# Patient Record
Sex: Male | Born: 1935 | Race: White | Hispanic: No | Smoking: Former smoker
Health system: Southern US, Community
[De-identification: ages and names within clinical notes are randomized; demographics above are authoritative.]

## PROBLEM LIST (undated history)

## (undated) DIAGNOSIS — N400 Enlarged prostate without lower urinary tract symptoms: Secondary | ICD-10-CM

## (undated) DIAGNOSIS — M1990 Unspecified osteoarthritis, unspecified site: Secondary | ICD-10-CM

## (undated) DIAGNOSIS — F32A Depression, unspecified: Secondary | ICD-10-CM

## (undated) DIAGNOSIS — I10 Essential (primary) hypertension: Secondary | ICD-10-CM

## (undated) DIAGNOSIS — B019 Varicella without complication: Secondary | ICD-10-CM

## (undated) DIAGNOSIS — E119 Type 2 diabetes mellitus without complications: Secondary | ICD-10-CM

## (undated) DIAGNOSIS — Z860100 Personal history of colon polyps, unspecified: Secondary | ICD-10-CM

## (undated) DIAGNOSIS — R569 Unspecified convulsions: Secondary | ICD-10-CM

## (undated) DIAGNOSIS — M5137 Other intervertebral disc degeneration, lumbosacral region: Secondary | ICD-10-CM

## (undated) DIAGNOSIS — G4730 Sleep apnea, unspecified: Secondary | ICD-10-CM

## (undated) DIAGNOSIS — M51379 Other intervertebral disc degeneration, lumbosacral region without mention of lumbar back pain or lower extremity pain: Secondary | ICD-10-CM

## (undated) DIAGNOSIS — F329 Major depressive disorder, single episode, unspecified: Secondary | ICD-10-CM

## (undated) DIAGNOSIS — N200 Calculus of kidney: Secondary | ICD-10-CM

## (undated) HISTORY — PX: CATARACT EXTRACTION, BILATERAL: SHX1313

## (undated) HISTORY — DX: Type 2 diabetes mellitus without complications: E11.9

## (undated) HISTORY — PX: JOINT REPLACEMENT: SHX530

## (undated) HISTORY — DX: Personal history of colonic polyps: Z86.010

## (undated) HISTORY — DX: Other intervertebral disc degeneration, lumbosacral region: M51.37

## (undated) HISTORY — DX: Sleep apnea, unspecified: G47.30

## (undated) HISTORY — PX: TONSILLECTOMY: SUR1361

## (undated) HISTORY — PX: PROSTATE SURGERY: SHX751

## (undated) HISTORY — DX: Unspecified osteoarthritis, unspecified site: M19.90

## (undated) HISTORY — DX: Varicella without complication: B01.9

## (undated) HISTORY — DX: Other intervertebral disc degeneration, lumbosacral region without mention of lumbar back pain or lower extremity pain: M51.379

## (undated) HISTORY — DX: Personal history of colon polyps, unspecified: Z86.0100

## (undated) HISTORY — DX: Calculus of kidney: N20.0

---

## 2002-01-26 HISTORY — PX: COLON SURGERY: SHX602

## 2005-07-25 ENCOUNTER — Ambulatory Visit: Payer: Self-pay | Admitting: Gastroenterology

## 2006-05-10 ENCOUNTER — Ambulatory Visit: Payer: Self-pay | Admitting: Internal Medicine

## 2007-04-17 ENCOUNTER — Ambulatory Visit: Payer: Self-pay | Admitting: Internal Medicine

## 2008-02-03 ENCOUNTER — Emergency Department: Payer: Self-pay | Admitting: Emergency Medicine

## 2011-12-27 HISTORY — PX: ORIF HIP FRACTURE: SHX2125

## 2012-01-03 ENCOUNTER — Inpatient Hospital Stay: Payer: Self-pay | Admitting: Orthopedic Surgery

## 2012-01-03 LAB — COMPREHENSIVE METABOLIC PANEL
Albumin: 3.5 g/dL (ref 3.4–5.0)
Alkaline Phosphatase: 64 U/L (ref 50–136)
BUN: 24 mg/dL — ABNORMAL HIGH (ref 7–18)
Bilirubin,Total: 0.5 mg/dL (ref 0.2–1.0)
Calcium, Total: 8.9 mg/dL (ref 8.5–10.1)
Chloride: 108 mmol/L — ABNORMAL HIGH (ref 98–107)
EGFR (African American): >60
EGFR (Non-African Amer.): >60
Glucose: 159 mg/dL — ABNORMAL HIGH (ref 65–99)
Potassium: 3.9 mmol/L (ref 3.5–5.1)
SGOT(AST): 23 U/L (ref 15–37)
SGPT (ALT): 22 U/L
Sodium: 142 mmol/L (ref 136–145)

## 2012-01-03 LAB — APTT: Activated PTT: 31.4 secs (ref 23.6–35.9)

## 2012-01-03 LAB — CBC WITH DIFFERENTIAL/PLATELET
Basophil #: 0 10*3/uL (ref 0.0–0.1)
Basophil %: 0.3 %
Eosinophil #: 0.2 10*3/uL (ref 0.0–0.7)
HGB: 13.9 g/dL (ref 13.0–18.0)
Lymphocyte #: 0.7 10*3/uL — ABNORMAL LOW (ref 1.0–3.6)
Lymphocyte %: 13.6 %
MCH: 29.3 pg (ref 26.0–34.0)
MCHC: 33.3 g/dL (ref 32.0–36.0)
MCV: 88 fL (ref 80–100)
Neutrophil %: 75.8 %
Platelet: 144 10*3/uL — ABNORMAL LOW (ref 150–440)
RBC: 4.74 10*6/uL (ref 4.40–5.90)
WBC: 5.5 10*3/uL (ref 3.8–10.6)

## 2012-01-03 LAB — URINALYSIS, COMPLETE
Bacteria: NONE SEEN
Glucose,UR: NEGATIVE mg/dL (ref 0–75)
Ketone: NEGATIVE
Nitrite: NEGATIVE
Protein: NEGATIVE
Specific Gravity: 1.017 (ref 1.003–1.030)
WBC UR: 1 /HPF (ref 0–5)

## 2012-01-03 LAB — PROTIME-INR
INR: 0.9
Prothrombin Time: 12.9 secs (ref 11.5–14.7)

## 2012-01-04 LAB — BASIC METABOLIC PANEL
BUN: 19 mg/dL — ABNORMAL HIGH (ref 7–18)
Calcium, Total: 8.4 mg/dL — ABNORMAL LOW (ref 8.5–10.1)
Chloride: 104 mmol/L (ref 98–107)
Co2: 29 mmol/L (ref 21–32)
Creatinine: 0.88 mg/dL (ref 0.60–1.30)
EGFR (African American): >60
Potassium: 3.9 mmol/L (ref 3.5–5.1)
Sodium: 140 mmol/L (ref 136–145)

## 2012-01-04 LAB — CBC WITH DIFFERENTIAL/PLATELET
Basophil #: 0 10*3/uL (ref 0.0–0.1)
Basophil %: 0.2 %
Eosinophil #: 0.4 10*3/uL (ref 0.0–0.7)
HCT: 39.2 % — ABNORMAL LOW (ref 40.0–52.0)
HGB: 13.2 g/dL (ref 13.0–18.0)
Lymphocyte #: 1.2 10*3/uL (ref 1.0–3.6)
MCV: 88 fL (ref 80–100)
Monocyte %: 7.6 %
Neutrophil #: 5.4 10*3/uL (ref 1.4–6.5)
Neutrophil %: 71.2 %
RDW: 13.9 % (ref 11.5–14.5)

## 2012-01-04 LAB — HEMOGLOBIN A1C: Hemoglobin A1C: 6.5 % — ABNORMAL HIGH (ref 4.2–6.3)

## 2012-01-05 LAB — CBC WITH DIFFERENTIAL/PLATELET
Basophil %: 0.2 %
Eosinophil #: 0 10*3/uL (ref 0.0–0.7)
Eosinophil %: 0.4 %
HCT: 36.9 % — ABNORMAL LOW (ref 40.0–52.0)
HGB: 12.5 g/dL — ABNORMAL LOW (ref 13.0–18.0)
Lymphocyte %: 9.3 %
MCH: 29.7 pg (ref 26.0–34.0)
MCHC: 33.9 g/dL (ref 32.0–36.0)
MCV: 88 fL (ref 80–100)
Monocyte #: 1.1 x10 3/mm — ABNORMAL HIGH (ref 0.2–1.0)
Monocyte %: 10.3 %
Neutrophil %: 79.8 %
Platelet: 134 10*3/uL — ABNORMAL LOW (ref 150–440)
RDW: 13.6 % (ref 11.5–14.5)

## 2012-01-05 LAB — BASIC METABOLIC PANEL
Anion Gap: 8 (ref 7–16)
BUN: 19 mg/dL — ABNORMAL HIGH (ref 7–18)
Calcium, Total: 8.4 mg/dL — ABNORMAL LOW (ref 8.5–10.1)
Chloride: 102 mmol/L (ref 98–107)
Co2: 28 mmol/L (ref 21–32)
EGFR (African American): >60
Glucose: 152 mg/dL — ABNORMAL HIGH (ref 65–99)
Osmolality: 281 (ref 275–301)
Potassium: 3.8 mmol/L (ref 3.5–5.1)

## 2012-01-05 LAB — PATHOLOGY REPORT

## 2012-01-06 ENCOUNTER — Ambulatory Visit: Payer: Self-pay | Admitting: Urology

## 2012-01-07 LAB — BASIC METABOLIC PANEL
Anion Gap: 7 (ref 7–16)
BUN: 27 mg/dL — ABNORMAL HIGH (ref 7–18)
Co2: 29 mmol/L (ref 21–32)
EGFR (Non-African Amer.): >60
Glucose: 143 mg/dL — ABNORMAL HIGH (ref 65–99)
Osmolality: 283 (ref 275–301)
Sodium: 138 mmol/L (ref 136–145)

## 2012-01-07 LAB — HEMOGLOBIN: HGB: 10.3 g/dL — ABNORMAL LOW (ref 13.0–18.0)

## 2012-01-09 LAB — BASIC METABOLIC PANEL
Anion Gap: 9 (ref 7–16)
BUN: 29 mg/dL — ABNORMAL HIGH (ref 7–18)
Calcium, Total: 8.5 mg/dL (ref 8.5–10.1)
Chloride: 100 mmol/L (ref 98–107)
Creatinine: 1.38 mg/dL — ABNORMAL HIGH (ref 0.60–1.30)
EGFR (African American): 53 — ABNORMAL LOW
EGFR (Non-African Amer.): 46 — ABNORMAL LOW
Glucose: 175 mg/dL — ABNORMAL HIGH (ref 65–99)
Potassium: 3.5 mmol/L (ref 3.5–5.1)
Sodium: 139 mmol/L (ref 136–145)

## 2012-01-09 LAB — CBC WITH DIFFERENTIAL/PLATELET
Eosinophil #: 0.2 10*3/uL (ref 0.0–0.7)
Lymphocyte #: 0.8 10*3/uL — ABNORMAL LOW (ref 1.0–3.6)
MCH: 29.3 pg (ref 26.0–34.0)
Monocyte #: 0.8 x10 3/mm (ref 0.2–1.0)
Neutrophil #: 6.9 10*3/uL — ABNORMAL HIGH (ref 1.4–6.5)
Neutrophil %: 78.5 %
Platelet: 214 10*3/uL (ref 150–440)
RBC: 3.63 10*6/uL — ABNORMAL LOW (ref 4.40–5.90)
RDW: 14.4 % (ref 11.5–14.5)

## 2012-01-10 ENCOUNTER — Encounter: Payer: Self-pay | Admitting: Internal Medicine

## 2012-01-27 ENCOUNTER — Encounter: Payer: Self-pay | Admitting: Internal Medicine

## 2012-04-22 ENCOUNTER — Ambulatory Visit: Payer: Self-pay | Admitting: Neurology

## 2012-05-01 ENCOUNTER — Ambulatory Visit: Payer: Self-pay | Admitting: Neurology

## 2012-09-20 ENCOUNTER — Ambulatory Visit: Payer: Self-pay | Admitting: Neurology

## 2013-07-25 ENCOUNTER — Inpatient Hospital Stay: Payer: Self-pay | Admitting: Internal Medicine

## 2013-07-25 LAB — CBC WITH DIFFERENTIAL/PLATELET
Basophil #: 0 10*3/uL (ref 0.0–0.1)
HCT: 46.7 % (ref 40.0–52.0)
Lymphocyte #: 0.2 10*3/uL — ABNORMAL LOW (ref 1.0–3.6)
MCHC: 33 g/dL (ref 32.0–36.0)
MCV: 89 fL (ref 80–100)
Monocyte #: 0.4 x10 3/mm (ref 0.2–1.0)
Monocyte %: 4.3 %
Neutrophil #: 9.1 10*3/uL — ABNORMAL HIGH (ref 1.4–6.5)
Neutrophil %: 92.9 %
Platelet: 184 10*3/uL (ref 150–440)
RDW: 13.6 % (ref 11.5–14.5)
WBC: 9.8 10*3/uL (ref 3.8–10.6)

## 2013-07-25 LAB — BASIC METABOLIC PANEL
Calcium, Total: 9 mg/dL (ref 8.5–10.1)
Creatinine: 1.46 mg/dL — ABNORMAL HIGH (ref 0.60–1.30)
EGFR (African American): 49 — ABNORMAL LOW
EGFR (Non-African Amer.): 42 — ABNORMAL LOW
Osmolality: 292 (ref 275–301)
Potassium: 4 mmol/L (ref 3.5–5.1)
Sodium: 140 mmol/L (ref 136–145)

## 2013-07-25 LAB — URINALYSIS, COMPLETE
Hyaline Cast: 3
Ketone: NEGATIVE
Ph: 5 (ref 4.5–8.0)
Protein: 100
Specific Gravity: 1.025 (ref 1.003–1.030)
WBC UR: 6 /HPF (ref 0–5)

## 2013-07-25 LAB — OCCULT BLOOD X 1 CARD TO LAB, STOOL: Occult Blood, Feces: NEGATIVE

## 2013-07-25 LAB — TROPONIN I: Troponin-I: 0.02 ng/mL

## 2013-07-27 LAB — CBC WITH DIFFERENTIAL/PLATELET
Eosinophil #: 0.4 10*3/uL (ref 0.0–0.7)
Eosinophil %: 6.9 %
HCT: 38.6 % — ABNORMAL LOW (ref 40.0–52.0)
Lymphocyte #: 1.3 10*3/uL (ref 1.0–3.6)
MCHC: 34.1 g/dL (ref 32.0–36.0)
MCV: 87 fL (ref 80–100)
Monocyte %: 12.4 %
Platelet: 147 10*3/uL — ABNORMAL LOW (ref 150–440)
RBC: 4.42 10*6/uL (ref 4.40–5.90)
WBC: 6.4 10*3/uL (ref 3.8–10.6)

## 2013-07-27 LAB — COMPREHENSIVE METABOLIC PANEL
Alkaline Phosphatase: 67 U/L
BUN: 21 mg/dL — ABNORMAL HIGH (ref 7–18)
Bilirubin,Total: 0.5 mg/dL (ref 0.2–1.0)
Calcium, Total: 8.8 mg/dL (ref 8.5–10.1)

## 2013-07-29 LAB — URINE CULTURE

## 2013-07-30 LAB — STOOL CULTURE

## 2014-01-09 ENCOUNTER — Ambulatory Visit: Payer: Self-pay | Admitting: Internal Medicine

## 2014-07-16 LAB — COMPREHENSIVE METABOLIC PANEL
ALT: 51 U/L
Albumin: 3.2 g/dL — ABNORMAL LOW (ref 3.4–5.0)
Alkaline Phosphatase: 91 U/L
Anion Gap: 6 — ABNORMAL LOW (ref 7–16)
BILIRUBIN TOTAL: 0.5 mg/dL (ref 0.2–1.0)
BUN: 18 mg/dL (ref 7–18)
CALCIUM: 8.5 mg/dL (ref 8.5–10.1)
CHLORIDE: 107 mmol/L (ref 98–107)
CO2: 28 mmol/L (ref 21–32)
Creatinine: 1.08 mg/dL (ref 0.60–1.30)
EGFR (Non-African Amer.): >60
Glucose: 170 mg/dL — ABNORMAL HIGH (ref 65–99)
Osmolality: 287 (ref 275–301)
POTASSIUM: 3.9 mmol/L (ref 3.5–5.1)
SGOT(AST): 47 U/L — ABNORMAL HIGH (ref 15–37)
Sodium: 141 mmol/L (ref 136–145)
TOTAL PROTEIN: 6.3 g/dL — AB (ref 6.4–8.2)

## 2014-07-16 LAB — CBC
HCT: 42.2 % (ref 40.0–52.0)
HGB: 13.9 g/dL (ref 13.0–18.0)
MCH: 30.2 pg (ref 26.0–34.0)
MCHC: 33 g/dL (ref 32.0–36.0)
MCV: 91 fL (ref 80–100)
Platelet: 138 10*3/uL — ABNORMAL LOW (ref 150–440)
RBC: 4.62 10*6/uL (ref 4.40–5.90)
RDW: 13.6 % (ref 11.5–14.5)
WBC: 6.2 10*3/uL (ref 3.8–10.6)

## 2014-07-16 LAB — TROPONIN I: TROPONIN-I: 0.03 ng/mL

## 2014-07-17 ENCOUNTER — Observation Stay: Payer: Self-pay | Admitting: Internal Medicine

## 2014-07-17 LAB — LIPID PANEL
Cholesterol: 117 mg/dL (ref 0–200)
HDL Cholesterol: 32 mg/dL — ABNORMAL LOW (ref 40–60)
Ldl Cholesterol, Calc: 65 mg/dL (ref 0–100)
Triglycerides: 99 mg/dL (ref 0–200)
VLDL Cholesterol, Calc: 20 mg/dL (ref 5–40)

## 2014-07-17 LAB — TSH: Thyroid Stimulating Horm: 3.92 u[IU]/mL

## 2014-07-17 LAB — HEMOGLOBIN A1C: Hemoglobin A1C: 7.5 % — ABNORMAL HIGH (ref 4.2–6.3)

## 2014-12-18 NOTE — H&P (Signed)
PATIENT NAME:  Patrick Chan, Patrick Chan MR#:  [id number] DATE OF BIRTH:  Jan 25, 1922  DATE OF ADMISSION:  07/25/2013  PRIMARY CARE PHYSICIAN: Dr. Aletha Halim; previously the patient used to go to Dr. Alonna Buckler.  CHIEF COMPLAINT:  Diarrhea.   HISTORY OF PRESENT ILLNESS: The patient is a 81 year old Caucasian male with a past medical history of hypertension, benign prostatic hypertrophy, obstructive sleep apnea and depression, is presenting to the ER with a chief complaint of persistent diarrhea for the past two days. The patient is reporting that after he had Thanksgiving dinner last night, he started at having diarrhea. He is having watery diarrhea with no blood. So far he had  20 BMs. He denies any nausea, vomiting or abdominal pain. No fever. Denies any dizziness, but complaining of weakness. He also reported no other friends or family members developed this problem following the dinner. In the ER, stool was ordered for C. Diff toxin, occult blood and culture. As the patient is feeling weak, CAT scan of the head is ordered, which is negative. Chest x-ray was also negative. The patient was given IV fluids and the hospitalist team is called to admit the patient. No other complaints.   PAST MEDICAL HISTORY: Hypertension, benign prostatic hypertrophy, obstructive sleep apnea, colon polyps, depression. In the previous records, it was mentioned that patient was diabetic. The patient is reporting that for the past six months he was under observation and diabetes is ruled out. Apparently, the patient is not on any medications and he is reporting that he is free from diabetes.   PAST SURGICAL HISTORY: Tonsil surgery, prostate resection secondary to benign prostatic hypertrophy in 1993, colon polyp removal, bilateral cataract surgery in 2010.  ALLERGIES: No known drug allergies.   PSYCHOSOCIAL HISTORY: Lives in the [facility] independent living facility. Denies any history of smoking, alcohol or illicit  drug usage.   FAMILY HISTORY: Both parents are healthy.   HOME MEDICATIONS: Tylenol 500 mg 0.5 mg once a day, tramadol 50 mg 1 tablet 2 times a day, multivitamin once daily, metoprolol tartrate 50 mg 1/2 tablet once daily, Keppra 750 mg 2 times a day, finasteride 5 mg once daily, citalopram 20 mg once daily, Celebrex 200 mg 1 capsule once a day, aspirin 81 mg once daily.   REVIEW OF SYSTEMS: CONSTITUTIONAL: Denies any fever, complaining of fatigue and weakness.  EYES: Denies blurry vision, glaucoma.  ENT: Denies epistaxis, discharge.  RESPIRATION:  Denies cough, COPD.   CARDIOVASCULAR: No chest pain or palpitations.  GASTROINTESTINAL: Denies nausea or vomiting. Complaining of diarrhea. Denies abdominal pain, hematemesis, melena,  GENITOURINARY: No dysuria or hematuria. Denies any hernias.  ENDOCRINE: Denies polyuria, nocturia. Denies any diabetes, thyroid problems. LYMPHATIC  No anemia, easy bruising, bleeding.  INTEGUMENTARY: No acne, rash, lesions.  MUSCULOSKELETAL: No joint pain in the neck and back. Denies gout.  NEUROLOGIC:  No vertigo or ataxia.  PSYCHIATRIC: No ADD, OCD.   PHYSICAL EXAMINATION: VITAL SIGNS: Temperature 98.8, pulse 74, respirations 20, blood pressure 145/75, pulse oximetry 96%.  GENERAL APPEARANCE: Not in acute distress. Moderately built and nourished.  HEENT: Normocephalic, atraumatic. Pupils are equally reactive to light and accommodation. Extraocular movements are intact. No scleral icterus. No conjunctival injection. No sinus tenderness, nasal patent, no discharge. Dry mucous membranes. Oral cavity is intact.  NECK: Patrick Chan. No JVD. No thyromegaly. Range of motion is intact.  LUNGS: Clear to auscultation bilaterally. No accessory muscle use and no anterior chest wall tenderness on palpation.  CARDIAC: S1, S2  normal. Regular rate and rhythm. No murmurs.  GASTROINTESTINAL: Soft. Bowel sounds are positive in all four quadrants. Nontender, nondistended. No  hepatosplenomegaly. No masses felt.  NEUROLOGIC: Awake, alert, oriented x 3. Cranial nerves II through XII are grossly intact. Motor and sensory intact. Reflexes are 2+.  EXTREMITIES: No edema. No cyanosis. No clubbing. Peripheral pulses, dorsalis pedis and posterior tibialis are intact.   SKIN: Warm to touch. Dry in nature. No turgor, no rashes, no lesions.  PSYCHIATRIC: Normal mood and affect.  LABS AND IMAGING STUDIES: CAT scan of the head without contrast has revealed no acute intracranial abnormalities, atrophic and small vessel ischemic changes. Chest x-ray, portable no acute changes. Glucose at 210, BUN 31, creatinine 1.46, sodium 146, potassium 4.0, chloride 111, CO2 21 and anion gap is 8, GFR 42. Serum osmolality 292, calcium 9. CBC normal. Troponin less than 0.02. Stool for C. difficile toxin is negative. Urinalysis yellow in color, nitrite and leukocyte esterase are negative. Stool for occult blood is negative.   ASSESSMENT AND PLAN: An 81 year old Caucasian male presenting to the ER with a chief complaint of a two day history of diarrhea with no abdominal pain, will be admitted with following assessment and plan.  1.  Acute gastroenteritis, probably viral. We will admit him to the hospital. We will provide him IV fluids. We will check his stool. Stool cultures are pending. We will provide him IV hydration.  2.  Dehydration with acute kidney injury. Avoid nephrotoxins. Probably, this is prerenal in nature from dehydration. Will provide IV fluids and monitor renal function closely.  3.  The patient will be on enteric precautions until stool tests results are obtained.   4.  Hypertension. Resume his home medication. Continue metoprolol.  5.  Benign prostatic hypertrophy. Resume Proscar.  6.  The patient is reporting that he is not diabetic. Will check his hemoglobin A1c.  7.  We will provide him gastrointestinal and deep vein thrombosis prophylaxis.  8.  He is full code. Son is medical  power of attorney. Diagnosis and plan of care was discussed in detail with the patient and his son at bedside. They both verbalized understanding of the plan.  The patient will be transferred to Dr. Aletha HalimJeff Sparks in a.m. The patient used to see Dr. Alonna BucklerAndrew Lamb in the past.   Total time spent on admission: 45 minutes.    ____________________________ Ramonita LabAruna Lailany Enoch, MD ag:NTS D: 07/26/2013 00:36:25 ET T: 07/26/2013 01:19:38 ET JOB#: [id number]  cc: Ramonita LabAruna Shawnte Demarest, MD, <Dictator> Ramonita LabARUNA Avnoor Koury MD ELECTRONICALLY SIGNED 07/30/2013 7:14

## 2014-12-18 NOTE — Discharge Summary (Signed)
PATIENT NAME:  Patrick Chan, Patrick Chan MR#:  [id number] DATE OF BIRTH:  1922/06/12  DATE OF ADMISSION:  07/25/2013 DATE OF DISCHARGE:  07/27/2013  DISCHARGE DIAGNOSES: 1.  Acute renal failure secondary to prerenal status/dehydration.  2.  Diarrhea secondary to Campylobacter.  3.  Weakness and confusion secondary to the above.   HOSPITAL COURSE:  Negative head CT, negative chest x-ray. Stool negative for Clostridium difficile, positive for Campylobacter jejuni. Creatinine decreased to  1.08 by today's date, 1.46 on admission with estimated GFR greater than 60. He ambulated well with PT greater than 250 feet. Glucose down to 114 this morning. Hemoglobin A1c was done at 7.2, will just give him diet-controlled diabetes which should be followed up going forward.   DISPOSITION:  [facility]. Needs to see Dr. Judithann SheenSparks next week and check in with Jacki ConesLaurie at [facility] as well.   TIME SPENT: Please note, it took approximately 40 minutes to do all discharge tasks.     ____________________________ Marya AmslerMarshall W. Dareen PianoAnderson, MD mwa:cs D: 07/27/2013 11:10:33 ET Chan: 07/27/2013 19:40:39 ET JOB#: [id number]  cc: Marya AmslerMarshall W. Dareen PianoAnderson, MD, <Dictator> Lauro RegulusMARSHALL W Shakiah Wester MD ELECTRONICALLY SIGNED 07/28/2013 8:02

## 2014-12-19 NOTE — H&P (Signed)
PATIENT NAME:  Patrick Chan, WESTMAN MR#:  [id number] DATE OF BIRTH:  [birth year]  DATE OF ADMISSION:  07/17/2014  ADMISSION DIAGNOSIS:  Transient ischemic attack.   REFERRING PHYSICIAN:  Loraine Leriche R. Fanny Bien, MD   PRIMARY CARE PHYSICIAN:  Duane Lope. Judithann Sheen, MD    HISTORY OF PRESENT ILLNESS:  This is a 81 year old Caucasian male who presents to the Emergency Department with diplopia. The patient had called his primary care doctor earlier in the afternoon after experiencing 2 to 3 hours of double vision associated with some nausea. His primary care doctor recommended the patient come to the Emergency Department for an MRI. In the Emergency Department, the patient states that his double vision and nausea have subsided. He denies any chest pain, shortness of breath, nausea, vomiting, or diaphoresis, but he admits to some dizziness when he was seeing double. Due to his new-onset symptoms of diplopia and the recommendation of his primary care doctor, the patient was admitted to the observation service for further workup of potential transient ischemic attack.   REVIEW OF SYSTEMS: CONSTITUTIONAL:  The patient denies fever or weakness.  EYES:  Denies inflammation or blurred vision.  EARS, NOSE, AND THROAT:  Denies tinnitus or difficulty swallowing.  RESPIRATORY:  Denies cough or shortness of breath.  CARDIOVASCULAR:  Denies chest pain, palpitations, or dyspnea on exertion.  GASTROINTESTINAL:  Admits to some nausea but denies vomiting, diarrhea, or abdominal pain.  GENITOURINARY:  Denies dysuria, increased frequency, or hesitancy of urination.  ENDOCRINE:  Denies polyuria or polydipsia.  HEMATOLOGIC AND LYMPHATIC:  Denies easy bleeding or bruising.  INTEGUMENT:  Denies rashes or lesions.  MUSCULOSKELETAL:  Admits to joint pain from arthritis, but denies myalgias. NEUROLOGIC:  Denies numbness in his extremities or dysarthria.  PSYCHIATRIC:  Denies depression or suicidal ideation.   PAST MEDICAL HISTORY:   Hypertension, obstructive sleep apnea, glaucoma, benign prostatic hypertrophy, osteoarthritis, and seizure disorder.   PAST SURGICAL HISTORY:  Right hip repair, bilateral cataract removal.   SOCIAL HISTORY:  The patient denies smoking, drinking, or doing any drugs. He is a widower and lives in an independent living facility or apartment.   FAMILY HISTORY:  None.   MEDICATIONS: 1.  Aspirin 81 mg delayed-release enteric-coated capsule 1 tab p.o. daily.  2.  Citalopram 20 mg 1 tab p.o. daily.  3.  Finasteride 5 mg 1 tab p.o. daily.  4.  Keppra 750 mg 1 tab p.o. b.i.d.  5.  Metoprolol tartrate 50 mg 1/2 tablet p.o. daily.  6.  Multivitamin tablet 1 tab p.o. daily.  7.  Tramadol 50 mg 1 tablet p.o. b.i.d. as needed for pain.  8.  Trazodone 50 mg 1/2 tablet to 1 tablet p.o. at bedtime.   ALLERGIES:  No known drug allergies.   PERTINENT LABORATORY RESULTS AND RADIOGRAPHIC FINDINGS:  Serum glucose is 170, BUN 18, creatinine 1.08, serum sodium 141, potassium 2.9, chloride 107, bicarbonate 28, calcium 8.5, serum albumin 3.2, alkaline phosphatase 91, AST 47, and ALT is 51. Troponin is 0.03. Thyroid stimulating hormone is 3.92. White blood cell count is 6.2, hemoglobin 13.9, hematocrit 42.2, and platelet count is 138.   CT of the head without contrast shows no acute intracranial pathology, but there is some chronic microvascular disease and cerebral atrophy.   PHYSICAL EXAMINATION: VITAL SIGNS:  Temperature is 97.9, pulse 60, respirations 20, blood pressure 190/81, pulse oximetry is 99% on room air.  GENERAL:  The patient is alert and oriented x 3 and in no apparent distress.  HEENT:  Normocephalic, atraumatic. Pupils are equal, round, and reactive to light and accommodation. Extraocular movements are intact. Mucous membranes are moist.  NECK:  Trachea is midline. No adenopathy.  CHEST:  Symmetric and atraumatic.  CARDIOVASCULAR:  Regular rate and rhythm. Normal S1, S2. No rubs, clicks, or murmurs  appreciated.  LUNGS:  Clear to auscultation bilaterally. Normal effort and excursion.  ABDOMEN:  Positive bowel sounds. Soft, nontender, nondistended. No hepatosplenomegaly.  GENITOURINARY:  Deferred.  MUSCULOSKELETAL:  The patient moves all 4 extremities equally. There is 5/5 strength in upper and lower extremities bilaterally.  SKIN:  No rashes or lesions.  EXTREMITIES:  No clubbing, cyanosis, or edema.   NEUROLOGIC:  Cranial nerves II through XII are grossly intact. The patient has no dysdiadochokinesis or past pointing. Visual fields are intact as well.  PSYCHIATRIC:  Mood is normal. Affect is congruent.   ASSESSMENT AND PLAN:  This is a 81 year old male with a presumed transient ischemic attack, who presents with diplopia.   1.  Transient ischemic attack. The patient admits to 2 to 3 hours of symptoms that have resolved. The etiology of diplopia is large a differential as even the complaint itself has room for misinterpretation. Due to his primary care doctor's concern for transient ischemic attack and his request for MRI, the patient has been admitted for observation. We will continue to monitor him. EKG shows no acute changes, but there is a first-degree block as well as a right bundle branch. I have ordered the MRI for the morning, and his primary care doctor may order a neurology consult if he sees fit.  2.  Hypertension. We will continue the patient's home regimen of metoprolol.  3.  Glaucoma. Continue Lumigan drops if needed.  4.  Benign prostatic hypertrophy. Continue finasteride.  5.  Seizure disorder. Continue Keppra.  6.  Obstructive sleep apnea. The patient usually wears CPAP at night. He does not have it here with him and he likely will not spend a full night in the hospital. If his stay is longer, we will restart the patient's positive pressure ventilation on his home settings.  7.  Osteoarthritis. Tramadol as needed for pain.  8.  Deep vein thrombosis prophylaxis with heparin.   9.  Gastrointestinal prophylaxis is unnecessary, as the patient is not critically ill.   TIME SPENT ON ADMISSION ORDERS AND PATIENT CARE:  Approximately 35 minutes.    ____________________________ Kelton PillarMichael S. Sheryle Hailiamond, MD msd:nb D: 07/17/2014 05:53:54 ET T: 07/17/2014 06:46:45 ET JOB#: [id number]  cc: Kelton PillarMichael S. Sheryle Hailiamond, MD, <Dictator> Kelton PillarMICHAEL S Osias Resnick MD ELECTRONICALLY SIGNED 07/18/2014 0:55

## 2014-12-20 NOTE — Consult Note (Signed)
PATIENT NAME:  Patrick Chan, [name] MR#:  [id number] DATE OF BIRTH:  Feb 13, 1922  DATE OF CONSULTATION:  01/03/2012  REFERRING PHYSICIAN:  Kathreen DevoidKevin L. Krasinski, MD   CONSULTING PHYSICIAN:  Marcina MillardAlexander Jann Ra, MD  REASON FOR CONSULTATION: Preoperative cardiovascular evaluation and abnormal EKG.   HISTORY OF PRESENT ILLNESS: The patient is an 81 year old gentleman referred for preoperative cardiovascular evaluation. The patient has a history of obstructive sleep apnea. He has a right hip fracture awaiting hip replacement surgery. The patient currently denies chest pain, shortness of breath, orthopnea, paroxysmal nocturnal dyspnea, pedal edema, presyncope, or syncope. The patient denies a history of hypertension, diabetes, chronic kidney disease, prior myocardial infarction, stroke or congestive heart failure. EKG reveals sinus rhythm with first degree AV block and right bundle branch block.   PAST MEDICAL HISTORY:  1. Hypertension.  2. Obstructive sleep apnea.  3. Benign prostatic hypertrophy.  4. Nephrolithiasis.  5. Diabetes.   MEDICATIONS:  1. Proscar 5 mg daily.  2. Lopressor 25 mg daily.  3. Escitalopram 20 mg daily.  4. Diazepam 5 mg every 6 hours p.r.n.  5. Multivitamin 1 daily.  6. Aspirin 81 mg daily.  7. Glucosamine/chondroitin 1 tab b.i.d.   SOCIAL HISTORY: [facility] [place]. He denies tobacco abuse.    FAMILY HISTORY: No immediate family history for coronary artery disease or myocardial infarction.    REVIEW OF SYSTEMS: CONSTITUTIONAL: No fever or chills. EYES: No blurry vision. EARS: No hearing loss. RESPIRATORY: No shortness of breath. CARDIOVASCULAR: No chest pain. GASTROINTESTINAL: No nausea, vomiting, or diarrhea. GU: No dysuria or hematuria. ENDOCRINE: No polyuria or polydipsia. MUSCULOSKELETAL: No arthralgias or myalgias. NEUROLOGICAL: No focal muscle weakness or numbness. PSYCHOLOGICAL: No depression or anxiety.   PHYSICAL  EXAMINATION:  VITAL SIGNS: Blood pressure 151/78, pulse 57, respirations 18, temperature 97.4, pulse oximetry 88%.   HEENT: Pupils are equal and reactive to light and accommodation.   NECK: Supple without thyromegaly.   LUNGS: Clear.   CARDIOVASCULAR: Normal jugular venous pressure. Normal point of maximal impulse. Regular rate and rhythm. Normal S1, S2. No appreciable gallop, murmur, or rub.   ABDOMEN: Soft and nontender. Pulses were intact bilaterally.   MUSCULOSKELETAL: Normal muscle tone.   NEUROLOGICAL: The patient is alert and oriented x3. Motor and sensory are both grossly intact.   IMPRESSION: An 81 year old gentleman awaiting right hip replacement surgery without high-risk features for cardiovascular complication during surgery. The patient has no prior history of myocardial infarction, stroke, congestive heart failure, chronic kidney disease, and currently is not taking any therapy for diabetes. Hip surgery would be considered as intermediate risk type of surgery. The patient would be a low risk for serious cardiovascular complication.   RECOMMENDATIONS:  1. Proceed with hip replacement surgery as planned.  2. Continue metoprolol pre-, peri and postoperatively.  3. No further cardiac noninvasive evaluation prior to surgery at this time.   ____________________________ Marcina MillardAlexander Travante Knee, MD ap:cbb D: 01/03/2012 16:36:44 ET T: 01/03/2012 17:47:47 ET JOB#: [id number]  cc: Marcina MillardAlexander Marquita Lias, MD, <Dictator> Marcina MillardALEXANDER Mattis Featherly MD ELECTRONICALLY SIGNED 01/04/2012 10:59

## 2014-12-20 NOTE — Op Note (Signed)
PATIENT NAME:  Patrick Chan, Patrick Chan MR#:  [id number] DATE OF BIRTH:  05/17/1922  DATE OF PROCEDURE:  01/04/2012  PREOPERATIVE DIAGNOSIS: Right femoral neck hip fracture.   POSTOPERATIVE DIAGNOSIS: Right femoral neck hip fracture.   PROCEDURE: Right hip hemiarthroplasty.   SURGEON: Patrick Chan, M.D.   ANESTHESIA: Spinal.   ESTIMATED BLOOD LOSS: 300 mL.   COMPLICATIONS: None.   INDICATIONS FOR PROCEDURE: [name] is an 81 year old active male. He sustained a mechanical fall when he tripped off a curb on his way to work. The patient fell onto his right side and sustained a right femoral neck hip fracture. He was unable to stand or bear weight on the right lower extremity following this injury. He was diagnosed with a displaced right femoral neck hip fracture by x-ray in the [facility]. Given his high level of functioning and the nature of this fracture, I recommended a right hip hemiarthroplasty. I reviewed the risks and benefits of surgery with the patient and his family. The patient has agreed to undergo surgery.   PROCEDURE NOTE: The patient was marked with the word yes over the right hip according the hospital's right site protocol. He was then brought to the operating room where he underwent placement of a spinal anesthetic by the anesthesia service. He was then placed in a left lateral  recumbent position. He was held into position with a pegboard. All of pegs were adequately padded to avoid undue pressure on the patient's skin. He was prepped and draped in sterile fashion. An axillary role was  placed under his left side and care was taken to pad his well leg and to avoid injury to the common peroneal nerve. The patient was then prepped and draped in a sterile fashion.   A time-out was performed to verify the patient's name, date of birth, medical record number, correct site of surgery, and correct procedure to be performed. It was also used to verify the  patient had received antibiotics and that all appropriate instruments, implants, and radiographic studies were available in the room. Once all in attendance were in agreement, the case began. He received 2 grams of Kefzol prior to the incision being made.   Anatomic landmarks were drawn out with a surgical marker as was the proposed incision. A #10 blade was used to make a curved incision based over the greater trochanter of the right hip. The subcutaneous tissues were carefully dissected using electrocautery until the fascia lata was identified. This was then cleared of overlying adipose tissue and a deep #10 blade was used to make the incision through the fascia lata. The gluteus maximus muscle was identified and then split in line with its fibers, revealing the underlying hip bursa. The bursa was resected and the external rotators were removed from their posterior attachment to the greater trochanter. They were tagged with a #2 Vicryl for later repair and reflected posteriorly to protect the sciatic nerve. The hip capsule was then identified. A Chan-shaped capsulotomy was performed and both leaflets of the hip capsule were tagged again with a #2 Ti-Cron for later repair. The fracture was then easily visualized. The femoral head was removed using a corkscrew device.   Attention was then turned initially to the femur. A femoral osteotomy template was used to mark the appropriate angle and level of the osteotomy. The osteotomy was performed approximately 1 cm above the lesser trochanter. After the femoral osteotomy was successfully completed, Cobra retractors were placed around the acetabulum. The  femoral head that was resected was measured to be 56-mm in diameter. The 56-mm trial was then placed in the acetabulum and found to have excellent suction fit.   Attention was then turned back to femoral canal preparation. A box osteotome created the initial entry into the proximal femur. A hand reamer was then advanced  into the femoral canal and a canal finding device was used to confirm the path of the femoral canal.   Sequential broaches were then used starting with a size zero and going up in half sizes until a size five trial broach was inserted. This was found to have the best medial and lateral fit and had no rotational instability. The trial neck and a 56-mm outer diameter head was then placed onto the femoral trial prosthesis and reduced. The hip was taken through a full range of motion and deemed to be stable. All trial instruments were then removed. The femoral canal and acetabulum were then copiously irrigated.   The actual size five Stryker Accolade HFX stem was then inserted into the femoral canal. The patient then was trialed with a neutral neck and a -4 neck trial prosthesis. The patient was found to have adequate stability, but better leg length with a -4. Therefore, a 56-mm head with a -4 neck sleeve was inserted onto the actual size five femoral component and reduced into the acetabulum. The hip was again taken through a full range of motion and found to have excellent motion and stability. The hip joint was then copiously irrigated.   The Chan-shaped capsulotomy was closed using #2 Ti-Cron.  A posterior repair with both the capsule and external rotators was then performed. This was a soft tissue repair. Again, the wound was copiously irrigated. The fascia lata was closed with interrupted 0 Vicryl sutures. The subcutaneous tissue was closed in two layers with 2-0 Vicryl and the skin approximated with skin staples. A dry sterile dressing was applied. The patient was then turned onto his back and his leg lengths were found to be equivalent. A hip abduction pillow was placed between his knees and he was transferred to a hospital bed and brought to the PAC-U in stable condition. I was scrubbed and present for the entire case and all sharp and instrument counts were correct at the conclusion of the case. I spoke  with the patient's family in the postoperative consultation room to let them know the case had gone without complication and that Mr. Letarte was stable in the recovery room.     ____________________________ Kathreen Devoid, MD klk:bjt D: 01/05/2012 12:29:37 ET Chan: 01/05/2012 12:47:33 ET JOB#: [id number]  cc: Kathreen Devoid, MD, <Dictator> Kathreen Devoid MD ELECTRONICALLY SIGNED 01/10/2012 9:32

## 2014-12-20 NOTE — H&P (Signed)
Subjective/Chief Complaint Right hip pain    History of Present Illness 81 year old male fell off a curb and landed on his right hip.  He was unable to stand after the injury.  He denies LOC or other injuries.  He complains only of right hip pain.  He was diagnosed with a right femoral neck hip fracture upon presentation to the [facility] ER by x-ray.   Past Med/Surgical Hx:  HTN:   glaucoma:   bordeline diabetic:   ALLERGIES:  No Known Allergies:   HOME MEDICATIONS: Medication Instructions Status  Proscar 5 mg oral tablet 1 tab(s) orally once a day Active  metoprolol tartrate 50 mg oral tablet 0.5 tab(s) orally once a day Active  citalopram 20 mg oral tablet 1 tab(s) orally once a day Active  diazepam 5 mg oral tablet 1 tab(s) orally every 6 hours, As Needed Active  multivitamin 1 tab(s) orally once a day Active  chondroitin-glucosamine 1 tab(s) orally 2 times a day (750-666m) Active  aspirin 81 mg oral delayed release tablet 1 tab(s) orally once a day Active  Celebrex 200 mg oral capsule 1 cap(s) orally once a day, As Needed- for Pain (may take twice a day if pain is worse, just occasionally) Active   Family and Social History:   Family History Non-Contributory    Place of Living Home   Review of Systems:   Subjective/Chief Complaint Right hip pain   Physical Exam:   GEN no acute distress    HEENT PERRL, hearing intact to voice, dry oral mucosa, Oropharynx clear, good dentition    NECK supple  No masses  trachea midline    RESP normal resp effort  clear BS  no use of accessory muscles    CARD regular rate  no murmur  No LE edema  no JVD    ABD denies tenderness  soft  normal BS  no Adominal Mass    GU foley catheter in place  mild hematuria likely from insertion in setting of BPH    LYMPH negative neck    EXTR negative cyanosis/clubbing, Right leg in Buck's traction.  Skin intact.  No erythema or eccyhymosis    SKIN normal to palpation    NEURO motor/sensory  function intact    PSYCH alert   Routine Coag:  08-May-13 08:52    Prothrombin 12.9   Activated PTT (APTT) 31.4  Routine Hem:  08-May-13 08:52    WBC (CBC) 5.5   RBC (CBC) 4.74   Hemoglobin (CBC) 13.9   Hematocrit (CBC) 41.6   Platelet Count (CBC) 144   MCV 88   MCH 29.3   MCHC 33.3   RDW 13.9   Neutrophil % 75.8   Lymphocyte % 13.6   Monocyte % 7.5   Eosinophil % 2.8   Basophil % 0.3   Neutrophil # 4.2   Lymphocyte # 0.7   Monocyte # 0.4   Eosinophil # 0.2   Basophil # 0.0  Routine Chem:  08-May-13 08:52    Glucose, Serum 159   BUN 24   Creatinine (comp) 0.94   Sodium, Serum 142   Potassium, Serum 3.9   Chloride, Serum 108   CO2, Serum 27   Calcium (Total), Serum 8.9  Hepatic:  08-May-13 08:52    Bilirubin, Total 0.5   Alkaline Phosphatase 64   SGPT (ALT) 22   SGOT (AST) 23   Total Protein, Serum 6.6   Albumin, Serum 3.5  Routine Chem:  08-May-13  08:52    Osmolality (calc) 291   eGFR (African American) >60   eGFR (Non-African American) >60   Anion Gap 7  Routine UA:  08-May-13 10:46    Color (UA) Yellow   Clarity (UA) Hazy   Glucose (UA) Negative   Bilirubin (UA) Negative   Ketones (UA) Negative   Specific Gravity (UA) 1.017   Blood (UA) 2+   pH (UA) 5.0   Protein (UA) Negative   Nitrite (UA) Negative   Leukocyte Esterase (UA) Negative   RBC (UA) 62 /HPF   WBC (UA) 1 /HPF   Mucous (UA) PRESENT  Routine BB:  08-May-13 11:08    Crossmatch Unit 1 Ready   Crossmatch Unit 2 Ready   Antibody Screen NEGATIVE   Radiology Results: XRay:    08-May-13 08:45, Hip Right Complete   Hip Right Complete   REASON FOR EXAM:    right hip pain  COMMENTS:       PROCEDURE: DXR - DXR HIP RIGHT COMPLETE  - Jan 03 2012  8:45AM     RESULT: A right proximal femoral fracture appears to be present in the   subcapital region. The femoral head remains present in theacetabulum. No   comminution is evident. The visualized pelvis appears intact.    IMPRESSION:   Right femoral neck fracture.    Dictation Site: 2      Thank you for this opportunity to contribute to the care of your patient.     Verified By: Sundra Aland, M.D., MD    08-May-13 08:46, Pelvis AP Only   Pelvis AP Only   REASON FOR EXAM:    right hip pain after fall  COMMENTS:       PROCEDURE: DXR - DXR PELVIS AP ONLY  - Jan 03 2012  8:46AM     RESULT: AP images of the pelvis show a right femoral neck fracture. The   pelvis is otherwise intact. The bowel gas pattern included on the study   is unremarkable. The left hip appears within normal limits. The   visualized sacrum appears unremarkable.    IMPRESSION:  Right proximal femoral fracture in the femoral neck likely   subcapital region.    Dictation Site: 2    Thank you for this opportunity to contribute to the care of your patient.         Verified By: Sundra Aland, M.D., MD    08-May-13 08:48, Chest 1 View AP or PA   Chest 1 View AP or PA   REASON FOR EXAM:    hip fracture  COMMENTS:       PROCEDURE: DXR - DXR CHEST 1 VIEWAP OR PA  - Jan 03 2012  8:48AM     RESULT: AP supine views of the chest show the heart is mildly prominent   which could be projectional. The lungs appear to be grossly clear. There   is pulmonary vascular fullness possibly positional. Atherosclerotic   calcification is noted in the aortic arch. There is no edema, effusion or   pneumothorax.    IMPRESSION:     1. Mild cardiomegaly.   2. Atherosclerotic disease.  Dictation Site: 2    Thank you for this opportunity to contribute to the care of your patient.         Verified By: Sundra Aland, M.D., MD     Assessment/Admission Diagnosis Right femoral neck fracture    Plan Patient was seen on 1A with  his family at the bedside including his wife and son.  I explained the diagnosis to them and recommended a hemiarthroplasty as treatment given his independent ambulation status at baseline.  Patient is functioning at a high  level and even works.  The risks and benefits of surgical intervention were discussed in detail with the patient and his family and they expressed understanding of the risks and benefits and agreed with plans for surgery. The risks include, but are not limited to: infection, bleeding requiring transfusion, nerve and blood vessel injury (especially the sciatic nerve leading to foot drop), dislocation, fracture, leg length discrepancy, change in leg rotation, the need for more surgery including conversion to a total hip arthroplasty, DVT, and PE, MI, stroke, pneumonia, respiratory failure and death.  I have consulted [facility] for pre-op clearance.  Patient will be NPO after midnight in preparation for surgery tomorrow AM at 7:30 pending medical clearance.   Electronic Signatures: Thornton Park (MD)  (Signed [phone] 13:18)  Authored: CHIEF COMPLAINT and HISTORY, PAST MEDICAL/SURGIAL HISTORY, ALLERGIES, HOME MEDICATIONS, FAMILY AND SOCIAL HISTORY, REVIEW OF SYSTEMS, PHYSICAL EXAM, LABS, Radiology, ASSESSMENT AND PLAN   Last Updated: 08-May-13 13:18 by Thornton Park (MD)

## 2014-12-20 NOTE — Discharge Summary (Signed)
PATIENT NAME:  Patrick Chan, Patrick Chan MR#:  [id number] DATE OF BIRTH:  1921-10-10  DATE OF ADMISSION:  01/03/2012 DATE OF DISCHARGE:  01/09/2012  ADMITTING DIAGNOSIS: Right femoral neck hip fracture.   HISTORY OF PRESENT ILLNESS: Patient is an 81 year old male who presented to the [facility] after he fell off a curb and landed on his right hip. He was on his way to work at the time. Patient was unable to stand after the injury. He denied loss of consciousness or other injuries. He was diagnosed with a right femoral neck hip fracture by x-ray upon presentation to the Emergency Department. He was admitted to the orthopedic surgery service for further evaluation and management.   HOME MEDICATIONS: 1. Proscar 5 mg daily. 2. Metoprolol 50 mg daily. 3. Citalopram 20 mg daily. 4. Multivitamin 1 tablet daily. 5. Chondroitin glucosamine 1 tablet b.i.d.  6. Aspirin 81 mg daily. 7. Celebrex 200 mg daily.   FAMILY HISTORY: Noncontributory.    PAST MEDICAL HISTORY:  1. Hypertension. 2. Glaucoma. 3. Borderline diabetes.   ALLERGIES: No known drug allergies.   SOCIAL HISTORY: Patient lives at home at baseline.   HOSPITAL COURSE: Patient was admitted on 01/03/2012. He was seen and evaluated by the orthopedic surgery service. [facility] also [facility] as a Research scientist (medical)consultant. On his preoperative EKG he was found to have bradycardia with first AV block. A cardiology consult was then requested. Dr. Darrold JunkerParaschos evaluated the patient. His recommendation was to proceed with surgery without further cardiac evaluation. Patient also had a Foley catheter inserted by the Emergency Department with hematuria. The urologist was consulted. Dr. Evelene CroonWolff felt that the Foley was properly placed and recommended continued hydration and monitoring and follow up with Dr. [name], his normal urologist. On hospital day #2 patient was brought to the Operating Room where he underwent an  uncomplicated right hemiarthroplasty. He was brought to the orthopedic floor following the successful surgery. He had daily labs drawn to monitor his hemoglobin and hematocrit as well as his electrolytes. Patient did not require blood transfusion during his hospitalization. He had 24 hours of antibiotics continued postoperatively. The [facility] medical service continued to follow him also throughout his hospitalization. He had physical and occupational therapy consults called for postoperative day #1 and they continued to monitor his progress throughout his hospitalization. Patient had a postoperative fever of 102.1. This cleared without any sequelae. Patient continued to use CPAP for obstructive sleep apnea. Patient had his bandage changed on postoperative day #2. He had mild blistering around the incision from Covaderm and an ABD dressing was reapplied. Patient had mild thigh swelling and ecchymosis but no signs of infection. Patient's Foley catheter was removed on postoperative day #2. Patient continued to improve. On 01/08/2012 patient had a fall coming out of the bathroom using his walker. Patient ended up tripping. He was sent for repeat x-rays which showed no evidence of hip fracture or hip dislocation. Patient was examined following this injury and complained of no pain. He remained neurovascularly intact. On postoperative day #5 patient was doing well. He had suffered from some constipation but had a bowel movement overnight. He had no complaints of pain in the right hip. The incision was clean, dry, and intact. Patient was [facility] and it was agreed the patient was ready for discharge. His hematocrit upon discharge was 32.6 with a hemoglobin of 10.6. His chemistry showed an elevated blood sugar of 175 and a mildly elevated  creatinine of 1.38.   Because of the slightly elevated creatinine patient was given a Foley catheter which is to remain in place until he sees Dr. Lonna Cobb,  his urologist, by the end of the month.   DISCHARGE INSTRUCTIONS:  1. Patient will be partial weight-bearing on the right lower extremity. He is to follow posterior hip precautions.  2. Patient will wear knee-high TED stockings which may be removed at bedtime until he follows up in my office.  3. He will remain on Lovenox 40 mg subcutaneous daily x4 weeks.  4. He will be seen back in the orthopedic office in 7 to 10 days for wound check and staple removal.  5. He will continue with physical and occupational therapy while at skilled nursing. He will require physical therapy for gait training, lower extremity strengthening and occupational therapy for activities of daily living.  6. Patient should have daily dressings until the incision is clean and dry.  7. He will be on Vicodin 5/325 mg tablets 1 to 2 tabs every four hours p.r.n. for pain.  8. Patient will restart all of his home medications.   ____________________________ Kathreen Devoid, MD klk:cms D: 01/10/2012 09:05:00 ET Chan: 01/10/2012 09:28:47 ET JOB#: [id number]  cc: Kathreen Devoid, MD, <Dictator> Kathreen Devoid MD ELECTRONICALLY SIGNED 01/11/2012 15:04

## 2014-12-20 NOTE — Consult Note (Signed)
Brief Consult Note: Diagnosis: POC for hip sugery, abnormal ECG, no high risk features.   Patient was seen by consultant.   Consult note dictated.   Comments: REC  Proceed with surgery, no further cardiac eval at this time.  Electronic Signatures: Marcina MillardParaschos, Azharia Surratt (MD)  (Signed [phone]-May-13 16:32)  Authored: Brief Consult Note   Last Updated: 08-May-13 16:32 by Marcina MillardParaschos, Tracina Beaumont (MD)

## 2014-12-20 NOTE — Consult Note (Signed)
Brief Consult Note: Diagnosis: Hematuria/BPH.   Patient was seen by consultant.   Consult note dictated.   Comments: Hematuria prob. due to Foley trauma and exposure to aspirin. Foley appears to be placed properly. Keep patient well hydrated and it should clear. Patient should keep follow-up appt. with his regular urologist [name].  Electronic Signatures: Orson ApeWolff, Vida Nicol R (MD)  (Signed [phone]-May-13 18:52)  Authored: Brief Consult Note   Last Updated: 08-May-13 18:52 by Orson ApeWolff, Orrie Schubert R (MD)

## 2014-12-20 NOTE — Consult Note (Signed)
PATIENT NAME:  Patrick Chan, [name] MR#:  [id number] DATE OF BIRTH:  12-27-1921  DATE OF CONSULTATION:  01/03/2012  REFERRING PHYSICIAN:  Juanell FairlyKevin Krasinski, MD  CONSULTING PHYSICIAN:  Suszanne ConnersMichael R. Evelene CroonWolff, MD  REASON FOR CONSULTATION: Hematuria.   HISTORY OF PRESENT ILLNESS: [name] is an 81 year old white male who fractured his right hip and had Foley catheter placed in the ER. The urine has been blood-tinged since that time. Prior to that he has had no history of hematuria. He does have history of BPH and is status post retropubic prostatectomy 15 years ago. He also has a history of elevated PSA and underwent several biopsies in the 1990's which were benign. He is on finasteride for presumed BPH and/or elevated PSA. He specifically states that he does not have prostate cancer. He normally follows up with Dr. [name] and has seen him as recently as eight months ago and was told everything was okay and that he did not need to be seen for a year.   ALLERGIES: No drug allergies.   CHRONIC MEDICATIONS:  1. Finasteride. 2. Lopressor. 3. Citalopram.  4. Diazepam. 5. Multivitamins. 6. Aspirin.  7. Glucosamine/chondroitin. 8. Metoprolol. 9. Celebrex.    PAST SURGICAL HISTORY:  1. Retropubic prostatectomy in 1995.  2. Colonoscopy. 3. Tonsillectomy. 4. Cataract surgery.   PAST AND CURRENT MEDICAL CONDITIONS:  1. Hypertension. 2. Benign prostatic hypertrophy. 3. Elevated PSA. 4. Sleep apnea.  5. History of kidney stones.  6. Diabetes. 7. Colon polyps.  8. Depression.   PHYSICAL EXAMINATION:   GENERAL: Elderly white male in no acute distress.   ABDOMEN: Abdomen was soft. He had a Pfannenstiel scar present.  GU: He was uncircumcised. Testes were atrophic.   RECTAL: 20 gram prostate with TURP defect. Foley catheter appeared to be positioned normally.   PERTINENT LABORATORY STUDIES: INR 0.9. Hematocrit 41.6%. BUN 24 and creatinine 0.94.   IMPRESSION:  1. Hematuria probably due to  Foley trauma and exposure to aspirin and Celebrex.  2. Benign prostatic hypertrophy status post retropubic prostatectomy. 3. History of elevated PSA.   PLAN:  1. Hydrate the patient and the urine should clear spontaneously. 2. Remove Foley when the patient is ambulatory.  3. Follow-up with Dr. [name] as per his routine and continue finasteride. 4. If urine does not clear after catheter removal, would consider upper tract evaluation and cystoscopy.   ____________________________ Suszanne ConnersMichael R. Evelene CroonWolff, MD [id number] D: 01/03/2012 18:57:51 ET T: 01/04/2012 10:08:46 ET JOB#: [id number] cc: Suszanne ConnersMichael R. Evelene CroonWolff, MD, <Dictator>, Reola MosherAndrew S. Randa LynnLamb, MD, Scott C. [name], MD Orson ApeMICHAEL R Sarahbeth Cashin MD ELECTRONICALLY SIGNED 01/04/2012 11:03

## 2014-12-20 NOTE — Consult Note (Signed)
PATIENT NAME:  Patrick Chan, Patrick Chan MR#:  [id number] DATE OF BIRTH:  1921/09/29  DATE OF CONSULTATION:  01/03/2012  ED REFERRING PHYSICIAN:  Olivia Mackie, MD   CONSULTING PHYSICIAN:  [name], MD PRIMARY CARE PHYSICIAN: Alonna Buckler, MD   CHIEF COMPLAINT: Right hip fracture.   HISTORY OF PRESENT ILLNESS: The patient is an 81 year old Caucasian male with a history of hypertension, benign prostatic hypertrophy, obstructive sleep apnea, sent to the ED with a fall and right pain. He was found to have right hip fracture. [facility] was called for preoperative clearance. The patient is alert, awake, oriented, in no acute distress. The patient denies any syncope, loss of consciousness or seizure. He denies any chest pain, palpitation, orthopnea, or nocturnal dyspnea. No cough, sputum, shortness of breath. He said he fell by accident. He denies any history of heart attack or stroke, but he has obstructive sleep apnea and is on CPAP at night. The patient developed hematuria after Foley in the Emergency Department .   PAST MEDICAL HISTORY:  1. Hypertension. 2. Benign prostatic hypertrophy. 3. Obstructive sleep apnea. 4. Nephrolithiasis.  5. Diabetes.  6. Colon polyps. 7. Depression.   PAST SURGICAL HISTORY:  1. Tonsil surgery. 2. Prostate resection secondary to benign prostatic hypertrophy in 1995. 3. Prostate biopsy which was benign in 1997. 4. Colonoscopy with polypectomy.  5. Bilateral cataracts in 2010.   REVIEW OF SYSTEMS: CONSTITUTIONAL: The patient denies any fever or chills. No headache or dizziness. No weight loss or weakness. HEENT: No double vision, blurred vision, but has a history of a cataract. No discharge from ear or nose. No epistaxis or postnasal drip. CARDIOVASCULAR: No chest pain, palpitation, no orthopnea. No nocturnal dyspnea. No leg edema. PULMONARY: No cough, sputum, shortness of breath or hemoptysis. GASTROINTESTINAL: No abdominal pain, nausea, vomiting, or  diarrhea. GU: No dysuria or incontinence but has hematuria after Foley in the ED. SKIN: No rash or jaundice. HEMATOLOGY: No easy bruising or bleeding. NEUROLOGY: No syncope, loss of consciousness or seizure. PSYCHIATRIC: No depression. No anxiety.   ALLERGIES: No known drug allergies.   MEDICATIONS:  1. Proscar 5 mg p.o. daily.  2. Lopressor 25 mg p.o. daily.  3. Escitalopram 20 mg p.o. once daily.  4. Diazepam 5 mg every 6 hours p.r.n.  5. Multivitamin 1 tablet once daily.  6. Aspirin 81 mg p.o. daily.  7.  200 mg p.o. once daily p.r.n.  8. Glucosamine chondroitin SUL sodium tablets 750/600 mg, 1 tablet p.o. twice daily.   PHYSICAL EXAMINATION:  VITAL SIGNS: Temperature 97.4, blood pressure 151/78, pulse 57, oxygen saturation 94% on 2 liters oxygen.   GENERAL: This patient is alert, awake, oriented, in no acute distress.   HEENT: Pupils are round, equal, and reactive to light and accommodation. Moist oral mucosa. Clear oropharynx.   NECK: Supple. No JVD or carotid bruits. No lymphadenopathy. No thyromegaly.   CARDIOVASCULAR: S1, S2 regular rate and rhythm. No murmurs or gallops.    LUNGS: Bilateral air entry. No wheezing or rales.   ABDOMEN: Soft, no distention or tenderness. No organomegaly. Bowel sounds are present.   SKIN: No rash or jaundice.   EXTREMITIES: No edema, clubbing, or cyanosis. No calf tenderness. Right leg on fixation.  NEUROLOGY: Alert and oriented x3. No focal deficits.   LABORATORY, DIAGNOSTIC AND RADIOLOGICAL DATA:  Urinalysis shows RBC 6.62. PT 12.9 INR 0.9, PTT 31.4.  WBC 5.5, hemoglobin 13.9, platelets 144. Glucose 159, BUN 24, creatinine 0.94, sodium 142, potassium 3.9, chloride 108,  bicarbonate 27.  Chest x-ray: Mild cardiomegaly, atherosclerotic disease  Pelvic x-ray shows a right femoral neck fracture.  Right hip x-ray shows right femoral neck fracture.  EKG showed sinus bradycardia at 53 beats per minute with first-degree AV block, right bundle  branch block.   IMPRESSION:  1. Right hip fracture.  2. Fall.  3. Dehydration.  4. Hypertension.  5. Diabetes.  6. Abnormal EKG with bradycardia, first AV block.  7. Benign prostatic hypertrophy.  8. Hematuria due to Foley insertion.  9. Obstructive sleep apnea.   RECOMMENDATIONS:  1. The patient needs a telemetry monitor.  2. We will continue Lopressor and get a CPAP at night for obstructive sleep apnea.  3. For hypertension, we will continue Lopressor and hydralazine IV p.r.n.  4. For diabetes, we will start sliding scale.  5. For abnormal EKG, we will get a Card[facility].  6. Deep vein thrombosis prophylaxis.  7. Dehydration: Agree continue IV fluid support and follow-up BMP.   The patient has a moderate risk for surgery. Also, the patient needs Cardiology clearance before surgery. I discussed the patient's situation and recommendations with the patient and the patient's wife. They voiced understanding and they agreed to the current recommendations.      TIME SPENT: About 60 minutes.  ____________________________ Shaune PollackQing Latia Mataya, MD qc:cbb D: 01/03/2012 14:30:15 ET T: 01/03/2012 15:05:31 ET JOB#: [id number]  cc: Shaune PollackQing Leilanee Righetti, MD, <Dictator> Reola MosherAndrew S. Randa LynnLamb, MD Shaune PollackQING Barlow Harrison MD ELECTRONICALLY SIGNED 01/03/2012 21:50

## 2015-10-10 ENCOUNTER — Emergency Department: Payer: Medicare Other

## 2015-10-10 ENCOUNTER — Inpatient Hospital Stay
Admission: EM | Admit: 2015-10-10 | Discharge: 2015-10-12 | DRG: 689 | Disposition: A | Discharge status: Home / Self Care | Payer: Medicare Other | Attending: Internal Medicine | Admitting: Internal Medicine

## 2015-10-10 DIAGNOSIS — N390 Urinary tract infection, site not specified: Secondary | ICD-10-CM | POA: Diagnosis present

## 2015-10-10 DIAGNOSIS — N183 Chronic kidney disease, stage 3 unspecified: Secondary | ICD-10-CM | POA: Insufficient documentation

## 2015-10-10 DIAGNOSIS — G9340 Encephalopathy, unspecified: Secondary | ICD-10-CM | POA: Diagnosis present

## 2015-10-10 DIAGNOSIS — N400 Enlarged prostate without lower urinary tract symptoms: Secondary | ICD-10-CM | POA: Diagnosis present

## 2015-10-10 DIAGNOSIS — N179 Acute kidney failure, unspecified: Secondary | ICD-10-CM | POA: Diagnosis present

## 2015-10-10 DIAGNOSIS — R569 Unspecified convulsions: Secondary | ICD-10-CM | POA: Diagnosis present

## 2015-10-10 DIAGNOSIS — G9341 Metabolic encephalopathy: Secondary | ICD-10-CM | POA: Diagnosis present

## 2015-10-10 DIAGNOSIS — R509 Fever, unspecified: Secondary | ICD-10-CM

## 2015-10-10 DIAGNOSIS — R4182 Altered mental status, unspecified: Secondary | ICD-10-CM

## 2015-10-10 DIAGNOSIS — I10 Essential (primary) hypertension: Secondary | ICD-10-CM | POA: Diagnosis present

## 2015-10-10 DIAGNOSIS — Z9989 Dependence on other enabling machines and devices: Secondary | ICD-10-CM

## 2015-10-10 DIAGNOSIS — F32A Depression, unspecified: Secondary | ICD-10-CM | POA: Diagnosis present

## 2015-10-10 DIAGNOSIS — F329 Major depressive disorder, single episode, unspecified: Secondary | ICD-10-CM | POA: Diagnosis present

## 2015-10-10 DIAGNOSIS — E119 Type 2 diabetes mellitus without complications: Secondary | ICD-10-CM

## 2015-10-10 DIAGNOSIS — G4733 Obstructive sleep apnea (adult) (pediatric): Secondary | ICD-10-CM | POA: Diagnosis present

## 2015-10-10 DIAGNOSIS — N189 Chronic kidney disease, unspecified: Secondary | ICD-10-CM

## 2015-10-10 DIAGNOSIS — B961 Klebsiella pneumoniae [K. pneumoniae] as the cause of diseases classified elsewhere: Secondary | ICD-10-CM | POA: Diagnosis present

## 2015-10-10 DIAGNOSIS — E1122 Type 2 diabetes mellitus with diabetic chronic kidney disease: Secondary | ICD-10-CM | POA: Diagnosis present

## 2015-10-10 HISTORY — DX: Depression, unspecified: F32.A

## 2015-10-10 HISTORY — DX: Essential (primary) hypertension: I10

## 2015-10-10 HISTORY — DX: Benign prostatic hyperplasia without lower urinary tract symptoms: N40.0

## 2015-10-10 HISTORY — DX: Unspecified convulsions: R56.9

## 2015-10-10 HISTORY — DX: Type 2 diabetes mellitus without complications: E11.9

## 2015-10-10 HISTORY — DX: Major depressive disorder, single episode, unspecified: F32.9

## 2015-10-10 LAB — COMPREHENSIVE METABOLIC PANEL
ALBUMIN: 4 g/dL (ref 3.5–5.0)
ALT: 26 U/L (ref 17–63)
ANION GAP: 11 (ref 5–15)
AST: 34 U/L (ref 15–41)
Alkaline Phosphatase: 81 U/L (ref 38–126)
BILIRUBIN TOTAL: 0.6 mg/dL (ref 0.3–1.2)
BUN: 29 mg/dL — ABNORMAL HIGH (ref 6–20)
CALCIUM: 9.4 mg/dL (ref 8.9–10.3)
CO2: 25 mmol/L (ref 22–32)
Chloride: 104 mmol/L (ref 101–111)
Creatinine, Ser: 1.45 mg/dL — ABNORMAL HIGH (ref 0.61–1.24)
GFR calc non Af Amer: 41 mL/min — ABNORMAL LOW (ref >60)
GFR, EST AFRICAN AMERICAN: 47 mL/min — AB (ref >60)
GLUCOSE: 171 mg/dL — AB (ref 65–99)
POTASSIUM: 4.5 mmol/L (ref 3.5–5.1)
SODIUM: 140 mmol/L (ref 135–145)
TOTAL PROTEIN: 7 g/dL (ref 6.5–8.1)

## 2015-10-10 LAB — CBC WITH DIFFERENTIAL/PLATELET
BASOS ABS: 0 10*3/uL (ref 0–0.1)
BASOS PCT: 1 %
Eosinophils Absolute: 0.2 10*3/uL (ref 0–0.7)
Eosinophils Relative: 2 %
HEMATOCRIT: 43.6 % (ref 40.0–52.0)
HEMOGLOBIN: 14.6 g/dL (ref 13.0–18.0)
LYMPHS PCT: 19 %
Lymphs Abs: 1.5 10*3/uL (ref 1.0–3.6)
MCH: 30.2 pg (ref 26.0–34.0)
MCHC: 33.5 g/dL (ref 32.0–36.0)
MCV: 90.2 fL (ref 80.0–100.0)
MONO ABS: 1 10*3/uL (ref 0.2–1.0)
Monocytes Relative: 12 %
NEUTROS ABS: 5.5 10*3/uL (ref 1.4–6.5)
NEUTROS PCT: 66 %
Platelets: 159 10*3/uL (ref 150–440)
RBC: 4.83 MIL/uL (ref 4.40–5.90)
RDW: 13.5 % (ref 11.5–14.5)
WBC: 8.2 10*3/uL (ref 3.8–10.6)

## 2015-10-10 LAB — URINALYSIS COMPLETE WITH MICROSCOPIC (ARMC ONLY)
Bilirubin Urine: NEGATIVE
Glucose, UA: NEGATIVE mg/dL
KETONES UR: NEGATIVE mg/dL
NITRITE: NEGATIVE
PROTEIN: NEGATIVE mg/dL
SPECIFIC GRAVITY, URINE: 1.014 (ref 1.005–1.030)
SQUAMOUS EPITHELIAL / LPF: NONE SEEN
pH: 5 (ref 5.0–8.0)

## 2015-10-10 LAB — GLUCOSE, CAPILLARY: Glucose-Capillary: 128 mg/dL — ABNORMAL HIGH (ref 65–99)

## 2015-10-10 MED ORDER — SODIUM CHLORIDE 0.9 % IV SOLN
INTRAVENOUS | Status: AC
  Administered 2015-10-10: via INTRAVENOUS

## 2015-10-10 MED ORDER — SODIUM CHLORIDE 0.9% FLUSH
3.0000 mL | Freq: Two times a day (BID) | INTRAVENOUS | Status: DC
  Administered 2015-10-10 – 2015-10-11 (×2): 3 mL via INTRAVENOUS

## 2015-10-10 MED ORDER — ENOXAPARIN SODIUM 40 MG/0.4ML ~~LOC~~ SOLN
40.0000 mg | SUBCUTANEOUS | Status: DC
  Administered 2015-10-11: 40 mg via SUBCUTANEOUS
  Filled 2015-10-10: qty 0.4

## 2015-10-10 MED ORDER — DEXTROSE 5 % IV SOLN
2.0000 g | INTRAVENOUS | Status: DC
  Administered 2015-10-11 – 2015-10-12 (×2): 2 g via INTRAVENOUS
  Filled 2015-10-10 (×2): qty 2

## 2015-10-10 MED ORDER — CEFTRIAXONE SODIUM 1 G IJ SOLR
1.0000 g | Freq: Once | INTRAMUSCULAR | Status: AC
  Administered 2015-10-10: 1 g via INTRAVENOUS
  Filled 2015-10-10: qty 10

## 2015-10-10 MED ORDER — ACETAMINOPHEN 325 MG PO TABS: 650.0000 mg | ORAL_TABLET | Freq: Four times a day (QID) | ORAL | Status: DC | PRN

## 2015-10-10 MED ORDER — INSULIN ASPART 100 UNIT/ML ~~LOC~~ SOLN: 0.0000 [IU] | Freq: Every day | SUBCUTANEOUS | Status: DC

## 2015-10-10 MED ORDER — ONDANSETRON HCL 4 MG PO TABS: 4.0000 mg | ORAL_TABLET | Freq: Four times a day (QID) | ORAL | Status: DC | PRN

## 2015-10-10 MED ORDER — ACETAMINOPHEN 650 MG RE SUPP: 650.0000 mg | Freq: Four times a day (QID) | RECTAL | Status: DC | PRN

## 2015-10-10 MED ORDER — INSULIN ASPART 100 UNIT/ML ~~LOC~~ SOLN
0.0000 [IU] | Freq: Three times a day (TID) | SUBCUTANEOUS | Status: DC
  Administered 2015-10-11: 2 [IU] via SUBCUTANEOUS
  Administered 2015-10-11: 1 [IU] via SUBCUTANEOUS
  Administered 2015-10-12: 13:00:00 2 [IU] via SUBCUTANEOUS
  Administered 2015-10-12: 1 [IU] via SUBCUTANEOUS
  Filled 2015-10-10: qty 1
  Filled 2015-10-10: qty 2
  Filled 2015-10-10: qty 1
  Filled 2015-10-10: qty 2

## 2015-10-10 MED ORDER — ONDANSETRON HCL 4 MG/2ML IJ SOLN: 4.0000 mg | Freq: Four times a day (QID) | INTRAMUSCULAR | Status: DC | PRN

## 2015-10-10 NOTE — Progress Notes (Signed)
Pharmacy Antibiotic Note  Patrick Chan is a 82 y.o. male admitted on 10/10/2015 with UTI.  Pharmacy has been consulted for ceftriaxone dosing.  Plan: Ceftriaxone 2 grams q 24 hours ordered.  Weight: 216 lb 1.6 oz (98.022 kg)  Temp (24hrs), Avg:99.1 F (37.3 C), Min:97.5 F (36.4 C), Max:100.8 F (38.2 C)   Recent Labs Lab 10/10/15 1924  WBC 8.2  CREATININE 1.45*    CrCl cannot be calculated (Unknown ideal weight.).    No Known Allergies  Antimicrobials this admission:   >>    >>   Dose adjustments this admission:   Microbiology results: 2/12 BCx: pending 2/12 UCx: pending   Sputum:    MRSA PCR:  UA: LE(+) NO2(-) WBC TNTC  Thank you for allowing pharmacy to be a part of this patient's care.  Jasper Hanf S 10/10/2015 11:33 PM

## 2015-10-10 NOTE — H&P (Signed)
[facility] - [place] at [facility]   PATIENT NAME: Patrick Chan    MR#:  [id number]  DATE OF BIRTH:  18-Jan-1922  DATE OF ADMISSION:  10/10/2015  PRIMARY CARE PHYSICIAN: Marguarite Arbour, MD   REQUESTING/REFERRING PHYSICIAN: Pershing Proud, MD  CHIEF COMPLAINT:   Chief Complaint  Patient presents with  . Fever    HISTORY OF PRESENT ILLNESS:  Patrick Chan  is a 82 y.o. male who presents with confusion and malaise. Patient's history is given mostly by his son who is present in the ED with him. Son states his father lives in independent assisted living facility. When he spoke with him earlier this morning the patient complained that he was not feeling well. By the afternoon when he checked on him again the son can tell the patient was acting more confused. He went to check on him and patient had a fever. He brought him to the ED for evaluation after the assisted living facility nurse felt like his urine had a strong smell. Patient does have a history of BPH, though he does not have frequent urinary tract infections. However, here he was found to be febrile and likely to have a UTI, nitrite positive on UA. Hospitals were called for admission.  PAST MEDICAL HISTORY:   Past Medical History  Diagnosis Date  . Diabetes mellitus without complication (HCC)   . Seizures (HCC)   . Depression   . BPH (benign prostatic hyperplasia)   . Hypertension     PAST SURGICAL HISTORY:   Past Surgical History  Procedure Laterality Date  . Joint replacement      SOCIAL HISTORY:   Social History  Substance Use Topics  . Smoking status: Former Games developer  . Smokeless tobacco: Not on file  . Alcohol Use: No    FAMILY HISTORY:   Family History  Problem Relation Age of Onset  . Stroke Mother   . Heart attack Mother     DRUG ALLERGIES:  No Known Allergies  MEDICATIONS AT HOME:   Prior to Admission medications   Medication Sig Start Date End Date Taking?  Authorizing Provider  citalopram (CELEXA) 10 MG tablet Take 20 mg by mouth daily.    Yes Historical Provider, MD  finasteride (PROSCAR) 5 MG tablet Take 5 mg by mouth daily.   Yes Historical Provider, MD  levETIRAcetam (KEPPRA) 750 MG tablet Take 750 mg by mouth 2 (two) times daily.   Yes Historical Provider, MD    REVIEW OF SYSTEMS:  Review of Systems  Constitutional: Positive for fever and malaise/fatigue. Negative for chills and weight loss.  HENT: Negative for ear pain, hearing loss and tinnitus.   Eyes: Negative for blurred vision, double vision, pain and redness.  Respiratory: Negative for cough, hemoptysis and shortness of breath.   Cardiovascular: Negative for chest pain, palpitations, orthopnea and leg swelling.  Gastrointestinal: Negative for nausea, vomiting, abdominal pain, diarrhea and constipation.  Genitourinary: Negative for dysuria, frequency and hematuria.  Musculoskeletal: Negative for back pain, joint pain and neck pain.  Skin:       No acne, rash, or lesions  Neurological: Negative for dizziness, tremors, focal weakness and weakness.       Confusion  Endo/Heme/Allergies: Negative for polydipsia. Does not bruise/bleed easily.  Psychiatric/Behavioral: Negative for depression. The patient is not nervous/anxious and does not have insomnia.      VITAL SIGNS:   Filed Vitals:   10/10/15 1908 10/10/15 1915 10/10/15 2030 10/10/15 2100  BP: 147/79  145/94  167/75  Pulse: 71 90 78 41  Temp: 100.8 F (38.2 C)     TempSrc: Oral     Resp: 19 27 24 26   SpO2: 96% 94% 97% 94%   Wt Readings from Last 3 Encounters:  No data found for Wt    PHYSICAL EXAMINATION:  Physical Exam  Vitals reviewed. Constitutional: He appears well-developed and well-nourished. No distress.  HENT:  Head: Normocephalic and atraumatic.  Mouth/Throat: Oropharynx is clear and moist.  Eyes: Conjunctivae and EOM are normal. Pupils are equal, round, and reactive to light. No scleral icterus.  Neck:  Normal range of motion. Neck supple. No JVD present. No thyromegaly present.  Cardiovascular: Normal rate and intact distal pulses.  Exam reveals no gallop and no friction rub.   No murmur heard. Irregular rhythm  Respiratory: Effort normal and breath sounds normal. No respiratory distress. He has no wheezes. He has no rales.  GI: Soft. Bowel sounds are normal. He exhibits no distension. There is no tenderness.  Musculoskeletal: Normal range of motion. He exhibits no edema.  No arthritis, no gout  Lymphadenopathy:    He has no cervical adenopathy.  Neurological: He is alert. No cranial nerve deficit.  No dysarthria, no aphasia  Skin: Skin is warm and dry. No rash noted. No erythema.  Psychiatric: He has a normal mood and affect. His behavior is normal. Judgment and thought content normal.    LABORATORY PANEL:   CBC  Recent Labs Lab 10/10/15 1924  WBC 8.2  HGB 14.6  HCT 43.6  PLT 159   ------------------------------------------------------------------------------------------------------------------  Chemistries   Recent Labs Lab 10/10/15 1924  NA 140  K 4.5  CL 104  CO2 25  GLUCOSE 171*  BUN 29*  CREATININE 1.45*  CALCIUM 9.4  AST 34  ALT 26  ALKPHOS 81  BILITOT 0.6   ------------------------------------------------------------------------------------------------------------------  Cardiac Enzymes No results for input(s): TROPONINI in the last 168 hours. ------------------------------------------------------------------------------------------------------------------  RADIOLOGY:  No results found.  EKG:   Orders placed or performed in visit on 07/17/14  . EKG 12-Lead    IMPRESSION AND PLAN:  Principal Problem:   Acute encephalopathy - likely secondary to his UTI, treat as below. Expect improvement as infection resolves. Active Problems:   UTI (lower urinary tract infection) - urine culture sent from the ED, blood culture was also sent, IV antibiotics  started and will continue these on admission.   Type 2 diabetes mellitus (HCC) - sliding scale insulin with corresponding glucose checks before meals and at bedtime, carb modified diet   BPH (benign prostatic hyperplasia) - continue home dose finasteride   HTN (hypertension) - currently at goal, continue home meds once these have been reconciled by pharmacy   Depression - continue home antidepressant   OSA on CPAP - CPAP daily at bedtime   Acute on chronic kidney failure - avoid nephrotoxins, gentle fluids, monitor for expected improvement  All the records are reviewed and case discussed with ED provider. Management plans discussed with the patient and/or family.  DVT PROPHYLAXIS: SubQ lovenox  GI PROPHYLAXIS: None  ADMISSION STATUS: Inpatient  CODE STATUS: DNR Code Status History    This patient does not have a recorded code status. Please follow your organizational policy for patients in this situation.    Advance Directive Documentation        Most Recent Value   Type of Advance Directive  Healthcare Power of Attorney, Living will   Pre-existing out of facility DNR order (yellow form or pink  MOST form)     "MOST" Form in Place?        TOTAL TIME TAKING CARE OF THIS PATIENT: 45 minutes.    Tamma Brigandi FIELDING 10/10/2015, 9:39 PM  [facility]  Office  [phone]  CC: Primary care physician; Marguarite Arbour, MD

## 2015-10-10 NOTE — ED Notes (Signed)
Pt had fever at nursing home.  Son states that facility reports pt has had increased urination.  Pt shaking with possible chills.  Presents with 100.8 degree fever.

## 2015-10-10 NOTE — ED Notes (Signed)
Pt assisted up to use urinal, dark cloudy urine with foul odor produced. Pt back to bed.

## 2015-10-10 NOTE — ED Provider Notes (Signed)
[facility] Emergency Department Provider Note  ____________________________________________  Time seen: Approximately 8 PM  I have reviewed the triage vital signs and the nursing notes.   HISTORY  Chief Complaint Fever    HPI Patrick Chan is a 82 y.o. male with a history of diabetes and seizures is presenting today with fever and altered mental status. Per his nursing home he has also had some increased urinary frequency. His son is at the bedside and says that the patient has been more confused over the past day and is usually very alert and oriented and knows the year.   Past Medical History  Diagnosis Date  . Diabetes mellitus without complication (HCC)   . Seizures (HCC)   . Depression   . BPH (benign prostatic hyperplasia)   . Hypertension     There are no active problems to display for this patient.   Past Surgical History  Procedure Laterality Date  . Joint replacement      No current outpatient prescriptions on file.  Allergies Review of patient's allergies indicates no known allergies.  No family history on file.  Social History Social History  Substance Use Topics  . Smoking status: Former Games developer  . Smokeless tobacco: Not on file  . Alcohol Use: No    Review of Systems Constitutional: Fever Eyes: No visual changes. ENT: No sore throat. Cardiovascular: Denies chest pain. Respiratory: Denies shortness of breath. Gastrointestinal: No abdominal pain.  No nausea, no vomiting.  No diarrhea.  No constipation. Genitourinary: Frequency. Musculoskeletal: Negative for back pain. Skin: Negative for rash. Neurological: Negative for headaches, focal weakness or numbness.  10-point ROS otherwise negative.  ____________________________________________   PHYSICAL EXAM:  VITAL SIGNS: ED Triage Vitals  Enc Vitals Group     BP 10/10/15 1900 147/79 mmHg     Pulse Rate 10/10/15 1900 55     Resp 10/10/15 1900 29     Temp  10/10/15 1908 100.8 F (38.2 C)     Temp Source 10/10/15 1908 Oral     SpO2 10/10/15 1900 96 %     Weight --      Height --      Head Cir --      Peak Flow --      Pain Score --      Pain Loc --      Pain Edu? --      Excl. in GC? --     Constitutional: Alert and oriented but not to the year. Well appearing and in no acute distress. Eyes: Conjunctivae are normal. PERRL. EOMI. Head: Atraumatic. Nose: No congestion/rhinnorhea. Mouth/Throat: Mucous membranes are moist.  Oropharynx non-erythematous. Neck: No stridor.   Cardiovascular: Normal rate, regular rhythm. Grossly normal heart sounds.  Good peripheral circulation. Respiratory: Normal respiratory effort.  No retractions. Minimal rales to right middle and lower fields Gastrointestinal: Soft and nontender. No distention. No abdominal bruits. No CVA tenderness. Musculoskeletal: No lower extremity tenderness nor edema.  No joint effusions. Neurologic:  Normal speech and language. No gross focal neurologic deficits are appreciated. No gait instability. Skin:  Skin is warm, dry and intact. No rash noted. Psychiatric: Mood and affect are normal. Speech and behavior are normal.  ____________________________________________   LABS (all labs ordered are listed, but only abnormal results are displayed)  Labs Reviewed  COMPREHENSIVE METABOLIC PANEL - Abnormal; Notable for the following:    Glucose, Bld 171 (*)    BUN 29 (*)    Creatinine, Ser 1.45 (*)  GFR calc non Af Amer 41 (*)    GFR calc Af Amer 47 (*)    All other components within normal limits  URINALYSIS COMPLETEWITH MICROSCOPIC ([facility] ONLY) - Abnormal; Notable for the following:    Color, Urine YELLOW (*)    APPearance CLOUDY (*)    Hgb urine dipstick 1+ (*)    Leukocytes, UA 3+ (*)    Bacteria, UA MANY (*)    All other components within normal limits  URINE CULTURE  CULTURE, BLOOD (ROUTINE X 2)  CULTURE, BLOOD (ROUTINE X 2)  CBC WITH DIFFERENTIAL/PLATELET    ____________________________________________  EKG  ED ECG REPORT I, Arelia Longest, the attending physician, personally viewed and interpreted this ECG.   Date: 10/10/2015  EKG Time: 1855  Rate: 114  Rhythm: normal sinus rhythm, P waves seen on the monitor.  Axis: Normal axis  Intervals:right bundle branch block  ST&T Change: No ST segment elevation or depression. No abnormal T-wave inversion. Right bundle branch block is seen on previous EKGs.   ____________________________________________  RADIOLOGY   ____________________________________________   PROCEDURES  ____________________________________________   INITIAL IMPRESSION / ASSESSMENT AND PLAN / ED COURSE  Pertinent labs & imaging results that were available during my care of the patient were reviewed by me and considered in my medical decision making (see chart for details).  Patient with obvious urinary tract infection on labs. Will admit to the hospital. Extend this plan to the patient as well as the son who agrees. Signed out to Dr. Anne Hahn. ____________________________________________   FINAL CLINICAL IMPRESSION(S) / ED DIAGNOSES  Urinary tract infection. Fever. Altered mental status.    Myrna Blazer, MD 10/10/15 2045

## 2015-10-11 LAB — GLUCOSE, CAPILLARY
GLUCOSE-CAPILLARY: 106 mg/dL — AB (ref 65–99)
Glucose-Capillary: 130 mg/dL — ABNORMAL HIGH (ref 65–99)
Glucose-Capillary: 160 mg/dL — ABNORMAL HIGH (ref 65–99)
Glucose-Capillary: 141 mg/dL — ABNORMAL HIGH (ref 65–99)

## 2015-10-11 LAB — CBC
HCT: 40.6 % (ref 40.0–52.0)
Hemoglobin: 13.5 g/dL (ref 13.0–18.0)
MCH: 30.2 pg (ref 26.0–34.0)
MCHC: 33.2 g/dL (ref 32.0–36.0)
MCV: 90.9 fL (ref 80.0–100.0)
PLATELETS: 136 10*3/uL — AB (ref 150–440)
RBC: 4.46 MIL/uL (ref 4.40–5.90)
RDW: 13.4 % (ref 11.5–14.5)
WBC: 6.9 10*3/uL (ref 3.8–10.6)

## 2015-10-11 LAB — BASIC METABOLIC PANEL
Anion gap: 4 — ABNORMAL LOW (ref 5–15)
BUN: 27 mg/dL — ABNORMAL HIGH (ref 6–20)
CHLORIDE: 106 mmol/L (ref 101–111)
CO2: 31 mmol/L (ref 22–32)
Calcium: 8.8 mg/dL — ABNORMAL LOW (ref 8.9–10.3)
Creatinine, Ser: 1.24 mg/dL (ref 0.61–1.24)
GFR calc non Af Amer: 49 mL/min — ABNORMAL LOW (ref >60)
GFR, EST AFRICAN AMERICAN: 57 mL/min — AB (ref >60)
GLUCOSE: 130 mg/dL — AB (ref 65–99)
Potassium: 4.1 mmol/L (ref 3.5–5.1)
Sodium: 141 mmol/L (ref 135–145)

## 2015-10-11 LAB — HEMOGLOBIN A1C: Hgb A1c MFr Bld: 7.5 % — ABNORMAL HIGH (ref 4.0–6.0)

## 2015-10-11 MED ORDER — GLIMEPIRIDE 2 MG PO TABS
2.0000 mg | ORAL_TABLET | Freq: Every day | ORAL | Status: DC
  Administered 2015-10-12: 2 mg via ORAL
  Filled 2015-10-11: qty 1

## 2015-10-11 MED ORDER — METOPROLOL TARTRATE 25 MG PO TABS
25.0000 mg | ORAL_TABLET | Freq: Every day | ORAL | Status: DC
  Administered 2015-10-11 – 2015-10-12 (×2): 25 mg via ORAL
  Filled 2015-10-11 (×2): qty 1

## 2015-10-11 MED ORDER — TRAMADOL HCL 50 MG PO TABS: 50.0000 mg | ORAL_TABLET | Freq: Four times a day (QID) | ORAL | Status: DC | PRN

## 2015-10-11 MED ORDER — ADULT MULTIVITAMIN W/MINERALS CH
1.0000 | ORAL_TABLET | Freq: Every day | ORAL | Status: DC
  Administered 2015-10-11 – 2015-10-12 (×2): 1 via ORAL
  Filled 2015-10-11 (×2): qty 1

## 2015-10-11 MED ORDER — TRAZODONE HCL 50 MG PO TABS
25.0000 mg | ORAL_TABLET | Freq: Every day | ORAL | Status: DC
  Administered 2015-10-11: 22:00:00 50 mg via ORAL
  Filled 2015-10-11: qty 2

## 2015-10-11 MED ORDER — CITALOPRAM HYDROBROMIDE 20 MG PO TABS
20.0000 mg | ORAL_TABLET | Freq: Every day | ORAL | Status: DC
  Administered 2015-10-11 – 2015-10-12 (×2): 20 mg via ORAL
  Filled 2015-10-11 (×2): qty 1

## 2015-10-11 MED ORDER — DOCUSATE SODIUM 100 MG PO CAPS
100.0000 mg | ORAL_CAPSULE | Freq: Two times a day (BID) | ORAL | Status: DC
  Administered 2015-10-11 – 2015-10-12 (×3): 100 mg via ORAL
  Filled 2015-10-11 (×3): qty 1

## 2015-10-11 MED ORDER — CELECOXIB 200 MG PO CAPS
200.0000 mg | ORAL_CAPSULE | Freq: Every day | ORAL | Status: DC
  Administered 2015-10-11 – 2015-10-12 (×2): 200 mg via ORAL
  Filled 2015-10-11 (×2): qty 1

## 2015-10-11 MED ORDER — FINASTERIDE 5 MG PO TABS
5.0000 mg | ORAL_TABLET | Freq: Every day | ORAL | Status: DC
  Administered 2015-10-11 – 2015-10-12 (×2): 5 mg via ORAL
  Filled 2015-10-11 (×2): qty 1

## 2015-10-11 MED ORDER — LOSARTAN POTASSIUM 50 MG PO TABS
50.0000 mg | ORAL_TABLET | Freq: Every day | ORAL | Status: DC
  Administered 2015-10-11 – 2015-10-12 (×2): 50 mg via ORAL
  Filled 2015-10-11 (×2): qty 1

## 2015-10-11 MED ORDER — LEVETIRACETAM 750 MG PO TABS
750.0000 mg | ORAL_TABLET | Freq: Two times a day (BID) | ORAL | Status: DC
  Administered 2015-10-11 – 2015-10-12 (×3): 750 mg via ORAL
  Filled 2015-10-11 (×3): qty 1

## 2015-10-11 NOTE — Care Management (Addendum)
Admitted to [facility] with the diagnosis of acute encephalopathy (UTI). Lives at [facility] x 13 years. Son is Tammy Sours [phone]) Clearence Cheek is Crissie Reese [phone]). Last seen Dr. Judithann Sheen 3-4 weeks ago. [facility] x 13 years. No home health. [facility] 3 years. Uses a rolling walker to aid in ambulation. Decreased appetite x 1 year. No falls. Takes care of all basic and instrumental activities of daily living himself, drives. Son will transport. Gwenette Greet RN MSN CCM Care Management [phone]

## 2015-10-11 NOTE — Progress Notes (Signed)
Physical Therapy Treatment Patient Details Name: Patrick Chan MRN: [id number] DOB: [birth year] Today's Date: 10/11/2015    History of Present Illness presented to ER secondary to confusion, general malaise; admitted with acute encephalopathy secondary to UTI.    PT Comments    Upon evaluation, patient alert and oriented to basic information; follow simple commands, but demonstrates limited insight into deficits.  Bilat UE/LE strength and ROM grossly WFL for basic transfers/mobility (bilat LE knee ext lacking approx 15 degrees bilat); denies pain at this time.  Currently able to complete bed mobility with mod indep; sit/stand, basic transfers and gait (220') with RW, min assist.  Mildly antalgic to R with R LE occasionally scissoring during gait trial, but no overt buckling or LOB.  Limited LE power noted with sit/stand transfers, and poor eccentric control noted with stand to sit. Would benefit from skilled PT to address above deficits and promote optimal return to PLOF; Recommend transition to HHPT and initial 24 hour sup/assist upon discharge from acute hospitalization.   Follow Up Recommendations  Home health PT;Supervision/Assistance - 24 hour     Equipment Recommendations       Recommendations for Other Services       Precautions / Restrictions Precautions Precautions: Fall Precaution Comments: Seizure history Restrictions Weight Bearing Restrictions: No    Mobility  Bed Mobility Overal bed mobility: Modified Independent                Transfers Overall transfer level: Needs assistance Equipment used: Rolling walker (2 wheeled) Transfers: Sit to/from Stand Sit to Stand: Min assist;Mod assist            Ambulation/Gait Ambulation/Gait assistance: Min assist Ambulation Distance (Feet): 220 Feet Assistive device: Rolling walker (2 wheeled)       General Gait Details: narrowed BOS with R LE occasionally scissoring past midline; mildly antalgic R LE.   Fair cadence/gait speed without overt buckling or LOB.  Frequently veers R, requiring cuing from therapist to correct.   Stairs            Wheelchair Mobility    Modified Rankin (Stroke Patients Only)       Balance Overall balance assessment: Needs assistance Sitting-balance support: No upper extremity supported;Feet supported Sitting balance-Leahy Scale: Good     Standing balance support: Bilateral upper extremity supported Standing balance-Leahy Scale: Fair                      Cognition Arousal/Alertness: Awake/alert Behavior During Therapy: WFL for tasks assessed/performed Overall Cognitive Status: Within Functional Limits for tasks assessed (oriented to self, location; unaware of situation and reason for admission.  Follows simple commands, but demonstrates limited insight into functional deficits)                      Exercises Other Exercises Other Exercises: Multiple reps of sit/stand, min/mod progressing to min assist with RW--requires UE support to complete due to decreased power bilat LEs; poor eccentric control to seating surface.  Constant cuing for hand placement to maximize safety.    General Comments        Pertinent Vitals/Pain Pain Assessment: No/denies pain    Home Living Family/patient expects to be discharged to:: Assisted living             Home Equipment: Walker - 2 wheels      Prior Function Level of Independence: Independent with assistive device(s)      Comments: Mod indep for ADLs  and household mobility (to/from dining hall at ALF) with RW; denies fall history.   PT Goals (current goals can now be found in the care plan section) Acute Rehab PT Goals Patient Stated Goal: [place] tomorrow" PT Goal Formulation: With patient Time For Goal Achievement: 10/25/15 Potential to Achieve Goals: Fair    Frequency  Min 2X/week    PT Plan      Co-evaluation              End of Session Equipment Utilized During Treatment: Gait belt Activity Tolerance: Patient tolerated treatment well Patient left: in chair;with call bell/phone within reach;with chair alarm set     Time: 1116-1140 PT Time Calculation (min) (ACUTE ONLY): 24 min  Charges:  $Therapeutic Activity: 8-22 mins                    G Codes:      Patrick Chan, PT, DPT, NCS 10/11/2015, 2:23 PM [phone]

## 2015-10-11 NOTE — Clinical Social Work Note (Signed)
CSW consulted as pt was admitted from a facility. CSW confirmed that pt was from [facility]. PT is recommending HHPT/24 hour supervision. Per converstation with PT, pt could progress and not need the 24 hour supervision. CSW will follow up with RNCM in the AM. CSW will sign off as this time as pt does not require STR at SNF. Please reconsult if a need arises prior to discharge.   Dede Query, MSW, LCSW  Clincial Social Worker [phone]

## 2015-10-11 NOTE — Progress Notes (Signed)
[facility] - [place] at [facility]   PATIENT NAME: Patrick Chan    MR#:  [id number]  DATE OF BIRTH:  1922-03-05  SUBJECTIVE:   Patient's confusion has pretty much resolved. Patient's son is at bedside.  REVIEW OF SYSTEMS:    Review of Systems  Constitutional: Positive for fever. Negative for chills and malaise/fatigue.  HENT: Negative for sore throat.   Eyes: Negative for blurred vision.  Respiratory: Negative for cough, hemoptysis, shortness of breath and wheezing.   Cardiovascular: Negative for chest pain, palpitations and leg swelling.  Gastrointestinal: Negative for nausea, vomiting, abdominal pain, diarrhea and blood in stool.  Genitourinary: Negative for dysuria.  Musculoskeletal: Negative for back pain.  Neurological: Negative for dizziness, tremors and headaches.  Endo/Heme/Allergies: Does not bruise/bleed easily.    Tolerating Diet: Yes      DRUG ALLERGIES:  No Known Allergies  VITALS:  Blood pressure 149/69, pulse 69, temperature 98.4 F (36.9 C), temperature source Oral, resp. rate 20, height 6' (1.829 m), weight 98.022 kg (216 lb 1.6 oz), SpO2 92 %.  PHYSICAL EXAMINATION:   Physical Exam  Constitutional: He is oriented to person, place, and time and well-developed, well-nourished, and in no distress. No distress.  HENT:  Head: Normocephalic.  Eyes: No scleral icterus.  Neck: Normal range of motion. Neck supple. No JVD present. No tracheal deviation present.  Cardiovascular: Normal rate, regular rhythm and normal heart sounds.  Exam reveals no gallop and no friction rub.   No murmur heard. Pulmonary/Chest: Effort normal and breath sounds normal. No respiratory distress. He has no wheezes. He has no rales. He exhibits no tenderness.  Abdominal: Soft. Bowel sounds are normal. He exhibits no distension and no mass. There is no tenderness. There is no rebound and no guarding.  Musculoskeletal: Normal range of motion. He exhibits  no edema.  Neurological: He is alert and oriented to person, place, and time.  Skin: Skin is warm. No rash noted. No erythema.  Psychiatric: Affect and judgment normal.      LABORATORY PANEL:   CBC  Recent Labs Lab 10/11/15 0432  WBC 6.9  HGB 13.5  HCT 40.6  PLT 136*   ------------------------------------------------------------------------------------------------------------------  Chemistries   Recent Labs Lab 10/10/15 1924 10/11/15 0432  NA 140 141  K 4.5 4.1  CL 104 106  CO2 25 31  GLUCOSE 171* 130*  BUN 29* 27*  CREATININE 1.45* 1.24  CALCIUM 9.4 8.8*  AST 34  --   ALT 26  --   ALKPHOS 81  --   BILITOT 0.6  --    ------------------------------------------------------------------------------------------------------------------  Cardiac Enzymes No results for input(s): TROPONINI in the last 168 hours. ------------------------------------------------------------------------------------------------------------------  RADIOLOGY:  Dg Chest 2 View  10/10/2015  CLINICAL DATA:  Fever and increased urination. History of diabetes, seizure, hypertension. EXAM: CHEST  2 VIEW COMPARISON:  07/25/2013 FINDINGS: Slightly shallow inspiration with atelectasis in the lung bases. Mild cardiac enlargement. No pulmonary vascular congestion. No focal airspace disease or consolidation in the lungs. No blunting of costophrenic angles. No pneumothorax. Calcified and tortuous aorta. Degenerative changes in the spine. IMPRESSION: Mild emphysematous changes in the lungs. Atelectasis in the lung bases. No evidence of active pulmonary disease. Electronically Signed   By: Burman Nieves M.D.   On: 10/10/2015 22:25     ASSESSMENT AND PLAN:   82 year old male with history of diabetes and seizures who presents with confusion and found to have urinary tract infection.  1. Metabolic encephalopathy: This is secondary  to urinary tract infection. Patient's mental status has improved with IV  fluids and IV antibiotics.  2. Urinary tract infection: Continue Rocephin. Follow up on urine culture.  3. Diabetes: Continue Amaryl and sliding scale insulin. 4. History seizures: Continue Keppra.  5. Essential hypertension: Continue losartan and metoprolol. Blood pressure is acceptable.     Management plans discussed with the patient's son and he is in agreement.  CODE STATUS: DO NOT RESUSCITATE  TOTAL TIME TAKING CARE OF THIS PATIENT: 30 minutes.     POSSIBLE D/C tomorrow, DEPENDING ON CLINICAL CONDITION.   Tova Vater M.D on 10/11/2015 at 10:59 AM  Between 7am to 6pm - Pager - [phone] After 6pm go to [email or web address] - password [email or web address] [facility]  [place] [facility]  Office  [phone]  CC: Primary care physician; Marguarite Arbour, MD  Note: This dictation was prepared with Dragon dictation along with smaller phrase technology. Any transcriptional errors that result from this process are unintentional.

## 2015-10-12 ENCOUNTER — Encounter
Admission: RE | Admit: 2015-10-12 | Discharge: 2015-10-12 | Disposition: A | Discharge status: Home / Self Care | Payer: Medicare Other | Source: Ambulatory Visit | Attending: Internal Medicine | Admitting: Internal Medicine

## 2015-10-12 DIAGNOSIS — E119 Type 2 diabetes mellitus without complications: Secondary | ICD-10-CM | POA: Insufficient documentation

## 2015-10-12 LAB — GLUCOSE, CAPILLARY
Glucose-Capillary: 141 mg/dL — ABNORMAL HIGH (ref 65–99)
Glucose-Capillary: 161 mg/dL — ABNORMAL HIGH (ref 65–99)

## 2015-10-12 LAB — URINE CULTURE: Culture: >=100000

## 2015-10-12 MED ORDER — TRAMADOL HCL 50 MG PO TABS: 50.0000 mg | ORAL_TABLET | Freq: Four times a day (QID) | ORAL | Status: DC | PRN

## 2015-10-12 MED ORDER — CEFUROXIME AXETIL 500 MG PO TABS: 500.0000 mg | ORAL_TABLET | Freq: Two times a day (BID) | ORAL | Status: DC

## 2015-10-12 NOTE — NC FL2 (Signed)
[place] MEDICAID FL2 LEVEL OF CARE SCREENING TOOL     IDENTIFICATION  Patient Name: Patrick Chan Birthdate: 07-02-1922 Sex: male Admission Date (Current Location): 10/10/2015  [place] and [place] Number:  Chiropodist and Address:  [facility], [address], [place], [place] [address]      Provider Number: [id number]  Attending Physician Name and Address:  Adrian Saran, MD  Relative Name and Phone Number:       Current Level of Care: Hospital Recommended Level of Care: Skilled Nursing Facility Prior Approval Number:    Date Approved/Denied:   PASRR Number: [id number]  Discharge Plan: SNF    Current Diagnoses: Patient Active Problem List   Diagnosis Date Noted  . Acute encephalopathy 10/10/2015  . UTI (lower urinary tract infection) 10/10/2015  . Type 2 diabetes mellitus (HCC) 10/10/2015  . Depression 10/10/2015  . BPH (benign prostatic hyperplasia) 10/10/2015  . HTN (hypertension) 10/10/2015  . OSA on CPAP 10/10/2015  . CKD (chronic kidney disease), stage III 10/10/2015  . Acute on chronic kidney failure (HCC) 10/10/2015    Orientation RESPIRATION BLADDER Height & Weight     Self, Time, Situation, Place  Normal, O2 (CPAP at Night) Continent Weight: 216 lb 1.6 oz (98.022 kg) Height:  6' (182.9 cm)  BEHAVIORAL SYMPTOMS/MOOD NEUROLOGICAL BOWEL NUTRITION STATUS      Continent Diet  AMBULATORY STATUS COMMUNICATION OF NEEDS Skin   Supervision Verbally Normal                       Personal Care Assistance Level of Assistance  Bathing, Feeding, Dressing Bathing Assistance: Limited assistance Feeding assistance: Independent Dressing Assistance: Limited assistance     Functional Limitations Info  Sight, Hearing, Speech Sight Info: Adequate Hearing Info: Adequate Speech Info: Adequate    SPECIAL CARE FACTORS FREQUENCY  PT (By licensed PT)                    Contractures      Additional Factors Info   Code Status, Allergies, Psychotropic Code Status Info: DNR Allergies Info: No known allergies Psychotropic Info: Medication         Current Medications (10/12/2015):  This is the current hospital active medication list Current Facility-Administered Medications  Medication Dose Route Frequency Provider Last Rate Last Dose  . acetaminophen (TYLENOL) tablet 650 mg  650 mg Oral Q6H PRN Oralia Manis, MD       Or  . acetaminophen (TYLENOL) suppository 650 mg  650 mg Rectal Q6H PRN Oralia Manis, MD      . cefTRIAXone (ROCEPHIN) 2 g in dextrose 5 % 50 mL IVPB  2 g Intravenous Q24H Oralia Manis, MD   2 g at 10/12/15 5621  . celecoxib (CELEBREX) capsule 200 mg  200 mg Oral Daily Adrian Saran, MD   200 mg at 10/12/15 0827  . citalopram (CELEXA) tablet 20 mg  20 mg Oral Daily Adrian Saran, MD   20 mg at 10/12/15 0827  . docusate sodium (COLACE) capsule 100 mg  100 mg Oral BID Adrian Saran, MD   100 mg at 10/12/15 0827  . enoxaparin (LOVENOX) injection 40 mg  40 mg Subcutaneous Q24H Oralia Manis, MD   40 mg at 10/11/15 2206  . finasteride (PROSCAR) tablet 5 mg  5 mg Oral Daily Adrian Saran, MD   5 mg at 10/12/15 0827  . glimepiride (AMARYL) tablet 2 mg  2 mg Oral Q breakfast Sital Mody,  MD   2 mg at 10/12/15 0828  . insulin aspart (novoLOG) injection 0-5 Units  0-5 Units Subcutaneous QHS Oralia Manis, MD   0 Units at 10/10/15 2357  . insulin aspart (novoLOG) injection 0-9 Units  0-9 Units Subcutaneous TID WC Oralia Manis, MD   1 Units at 10/12/15 [phone]  . levETIRAcetam (KEPPRA) tablet 750 mg  750 mg Oral BID Adrian Saran, MD   750 mg at 10/12/15 0827  . losartan (COZAAR) tablet 50 mg  50 mg Oral Daily Adrian Saran, MD   50 mg at 10/12/15 0827  . metoprolol tartrate (LOPRESSOR) tablet 25 mg  25 mg Oral Daily Adrian Saran, MD   25 mg at 10/12/15 0828  . multivitamin with minerals tablet 1 tablet  1 tablet Oral Daily Adrian Saran, MD   1 tablet at 10/12/15 0827  . ondansetron (ZOFRAN) tablet 4 mg  4 mg Oral Q6H PRN Oralia Manis, MD       Or  . ondansetron [facility]) injection 4 mg  4 mg Intravenous Q6H PRN Oralia Manis, MD      . sodium chloride flush (NS) 0.9 % injection 3 mL  3 mL Intravenous Q12H Oralia Manis, MD   3 mL at 10/11/15 2200  . traMADol (ULTRAM) tablet 50 mg  50 mg Oral Q6H PRN Adrian Saran, MD      . traZODone (DESYREL) tablet 25-50 mg  25-50 mg Oral QHS Adrian Saran, MD   50 mg at 10/11/15 2206     Discharge Medications: Please see discharge summary for a list of discharge medications.  Relevant Imaging Results:  Relevant Lab Results:   Additional Information SSN:  [id number]  Dede Query, LCSW

## 2015-10-12 NOTE — Care Management Important Message (Signed)
Important Message  Patient Details  Name: Patrick Chan MRN: [id number] Date of Birth: 1922-01-21   Medicare Important Message Given:  Yes    Olegario Messier A Naylani Bradner 10/12/2015, 10:13 AM

## 2015-10-12 NOTE — Discharge Summary (Signed)
[facility] - [place] at [facility]   PATIENT NAME: Patrick Chan    MR#:  [id number]  DATE OF BIRTH:  1921/12/17  DATE OF ADMISSION:  10/10/2015 ADMITTING PHYSICIAN: Oralia Manis, MD  DATE OF DISCHARGE: 10/12/2015  PRIMARY CARE PHYSICIAN: SPARKS,JEFFREY D, MD    ADMISSION DIAGNOSIS:  UTI (lower urinary tract infection) [N39.0] Fever, unspecified fever cause [R50.9] Altered mental status, unspecified altered mental status type [R41.82]  DISCHARGE DIAGNOSIS:  Principal Problem:   Acute encephalopathy Active Problems:   UTI (lower urinary tract infection)   Type 2 diabetes mellitus (HCC)   Depression   BPH (benign prostatic hyperplasia)   HTN (hypertension)   OSA on CPAP   Acute on chronic kidney failure (HCC)   SECONDARY DIAGNOSIS:   Past Medical History  Diagnosis Date  . Diabetes mellitus without complication (HCC)   . Seizures (HCC)   . Depression   . BPH (benign prostatic hyperplasia)   . Hypertension     HOSPITAL COURSE:   82 year old male with history of diabetes and seizures who presents with confusion and found to have urinary tract infection.  1. Metabolic encephalopathy: This is secondary to Klebsiella urinary tract infection. Patient's mental status has improved with IV fluids and IV antibiotics.  2. Klebsiella Urinary tract infection: He was on IV Rocephin while in the hospital and will be discharged with PO CEFTIN. Blood cultures are negative  3. Diabetes: Continue Amaryl and ADA diet  4. History seizures: Continue Keppra.  5. Essential hypertension: Continue losartan and metoprolol at discharge. Blood pressure is acceptable   DISCHARGE CONDITIONS AND DIET:   Diabetic diet stable  CONSULTS OBTAINED:     DRUG ALLERGIES:  No Known Allergies  DISCHARGE MEDICATIONS:   Current Discharge Medication List    START taking these medications   Details  cefUROXime (CEFTIN) 500 MG tablet Take 1 tablet (500 mg total) by  mouth 2 (two) times daily with a meal. Qty: 24 tablet, Refills: 0      CONTINUE these medications which have NOT CHANGED   Details  celecoxib (CELEBREX) 200 MG capsule Take 200 mg by mouth daily.    citalopram (CELEXA) 20 MG tablet Take 20 mg by mouth daily.    docusate sodium (COLACE) 100 MG capsule Take 100 mg by mouth 2 (two) times daily.    finasteride (PROSCAR) 5 MG tablet Take 5 mg by mouth daily.    glimepiride (AMARYL) 2 MG tablet Take 2 mg by mouth daily with breakfast.    levETIRAcetam (KEPPRA) 750 MG tablet Take 750 mg by mouth 2 (two) times daily.    losartan (COZAAR) 50 MG tablet Take 50 mg by mouth daily.    metoprolol (LOPRESSOR) 50 MG tablet Take 25 mg by mouth daily.    Multiple Vitamin (MULTIVITAMIN WITH MINERALS) TABS tablet Take 1 tablet by mouth daily.    traMADol (ULTRAM) 50 MG tablet Take 50 mg by mouth every 6 (six) hours as needed for moderate pain.    traZODone (DESYREL) 50 MG tablet Take 25-50 mg by mouth at bedtime.              Today   CHIEF COMPLAINT:  Doing well this am.    VITAL SIGNS:  Blood pressure 133/66, pulse 72, temperature 97.9 F (36.6 C), temperature source Oral, resp. rate 18, height 6' (1.829 m), weight 98.022 kg (216 lb 1.6 oz), SpO2 96 %.   REVIEW OF SYSTEMS:  Review of Systems  Constitutional: Negative for  fever, chills and malaise/fatigue.  HENT: Negative for sore throat.   Eyes: Negative for blurred vision.  Respiratory: Negative for cough, hemoptysis, shortness of breath and wheezing.   Cardiovascular: Negative for chest pain, palpitations and leg swelling.  Gastrointestinal: Negative for nausea, vomiting, abdominal pain, diarrhea and blood in stool.  Genitourinary: Negative for dysuria.  Musculoskeletal: Negative for back pain.  Neurological: Negative for dizziness, tremors and headaches.  Endo/Heme/Allergies: Does not bruise/bleed easily.     PHYSICAL EXAMINATION:  GENERAL:  82 y.o.-year-old patient  lying in the bed with no acute distress.  NECK:  Supple, no jugular venous distention. No thyroid enlargement, no tenderness.  LUNGS: Normal breath sounds bilaterally, no wheezing, rales,rhonchi  No use of accessory muscles of respiration.  CARDIOVASCULAR: S1, S2 normal. No murmurs, rubs, or gallops.  ABDOMEN: Soft, non-tender, non-distended. Bowel sounds present. No organomegaly or mass.  EXTREMITIES: No pedal edema, cyanosis, or clubbing.  PSYCHIATRIC: The patient is alert and oriented x 3  SKIN: No obvious rash, lesion, or ulcer.   DATA REVIEW:   CBC  Recent Labs Lab 10/11/15 0432  WBC 6.9  HGB 13.5  HCT 40.6  PLT 136*    Chemistries   Recent Labs Lab 10/10/15 1924 10/11/15 0432  NA 140 141  K 4.5 4.1  CL 104 106  CO2 25 31  GLUCOSE 171* 130*  BUN 29* 27*  CREATININE 1.45* 1.24  CALCIUM 9.4 8.8*  AST 34  --   ALT 26  --   ALKPHOS 81  --   BILITOT 0.6  --     Cardiac Enzymes No results for input(s): TROPONINI in the last 168 hours.  Microbiology Results  @MICRORSLT48 @  RADIOLOGY:  Dg Chest 2 View  10/10/2015  CLINICAL DATA:  Fever and increased urination. History of diabetes, seizure, hypertension. EXAM: CHEST  2 VIEW COMPARISON:  07/25/2013 FINDINGS: Slightly shallow inspiration with atelectasis in the lung bases. Mild cardiac enlargement. No pulmonary vascular congestion. No focal airspace disease or consolidation in the lungs. No blunting of costophrenic angles. No pneumothorax. Calcified and tortuous aorta. Degenerative changes in the spine. IMPRESSION: Mild emphysematous changes in the lungs. Atelectasis in the lung bases. No evidence of active pulmonary disease. Electronically Signed   By: Burman Nieves M.D.   On: 10/10/2015 22:25      Management plans discussed with the patient and he is in agreement. Stable for discharge   Patient should follow up with PCP in 1 week  CODE STATUS:     Code Status Orders        Start     Ordered    10/10/15 2302  Do not attempt resuscitation (DNR)   Continuous    Question Answer Comment  In the event of cardiac or respiratory ARREST Do not call a "code blue"   In the event of cardiac or respiratory ARREST Do not perform Intubation, CPR, defibrillation or ACLS   In the event of cardiac or respiratory ARREST Use medication by any route, position, wound care, and other measures to relive pain and suffering. May use oxygen, suction and manual treatment of airway obstruction as needed for comfort.      10/10/15 2301    Code Status History    Date Active Date Inactive Code Status Order ID Comments User Context   This patient has a current code status but no historical code status.    Advance Directive Documentation        Most Recent Value   Type  of Customer service manager Power of Attorney, Living will   Pre-existing out of facility DNR order (yellow form or pink MOST form)     "MOST" Form in Place?        TOTAL TIME TAKING CARE OF THIS PATIENT: 35 minutes.    Note: This dictation was prepared with Dragon dictation along with smaller phrase technology. Any transcriptional errors that result from this process are unintentional.  Mattia Osterman M.D on 10/12/2015 at 8:23 AM  Between 7am to 6pm - Pager - [phone] After 6pm go to [email or web address] - password [email or web address] [facility]  [place] [facility]  Office  [phone]  CC: Primary care physician; Marguarite Arbour, MD

## 2015-10-12 NOTE — Care Management (Signed)
Spoke with Mr. Pittinger and son Tammy Sours, at the bedside. Discussed that Mr. Faulkner has 14 days at [facility] in his contract for care. Son and Mr. Mcpartlin both agree that they should take advantage of these days. Dede Query Clinical Social Worker updated. Gwenette Greet RN MSN CCM Care Management [phone]

## 2015-10-12 NOTE — Plan of Care (Signed)
Pt d/ced.  Will be going to [facility] for 14 days respite care - Rm 344.  Gave report to [employer] at [phone].  Pt's mental status/UTI improved on IVF and abx.  Will be on PO Ceftin.  Blood cultures negative.  IV removed.  Son will be transporting him to [place].

## 2015-10-12 NOTE — Clinical Social Work Note (Signed)
Clinical Social Work Assessment  Patient Details  Name: Patrick Chan MRN: [id number] Date of Birth: [birth year]  Date of referral:  10/12/15               Reason for consult:  Facility Placement                Permission sought to share information with:  Family Supports Permission granted to share information::  Yes, Verbal Permission Granted  Name::     Declin Rajan, son and POA   Housing/Transportation Living arrangements for the past 2 months:  [place] Statistician) Source of Information:  Patient, Adult Children Patient Interpreter Needed:  None Criminal Activity/Legal Involvement Pertinent to Current Situation/Hospitalization:  No - Comment as needed Significant Relationships:  Adult Children Lives with:  Self Do you feel safe going back to the place where you live?  Yes Need for family participation in patient care:  Yes (Comment)  Care giving concerns:  Pt lives alone at [place] at [facility]    Social Worker assessment / plan:  CSW met with pt to address consult. CSW introduced herself and explained role of social work. CSW also explained the process of discharging to [facility] (the SNF for campus residents) while using his respite days. Pt has 14 respite days available. Pt has not had a 3 night inpatient qualifying stay and it is not needed for placement for respite. Pt and son are agreeable to discharge plan. Facility has received discharge information and is ready to admit pt. Pt will admit to room#344. RN will call report to [phone]. Pt's son will provide transportation. CSW is signing off as no further needs identified.   Employment status:  Retired Engineer, technical sales PT Recommendations:  Home with [place], [place] / Referral to community resources:  [place]  Patient/Family's Response to care:  Pt and son were appreciative of CSW support.    Patient/Family's Understanding of and Emotional Response to Diagnosis, Current Treatment, and Prognosis:  Pt and son are aware that pt would benefit from 14 days at respite at skilled.   Emotional Assessment Appearance:  Appears stated age Attitude/Demeanor/Rapport:  Other (Appropriate) Affect (typically observed):  Accepting, Adaptable, Pleasant Orientation:  Oriented to Self, Oriented to Place, Oriented to  Time, Oriented to Situation Alcohol / Substance use:  Never Used Psych involvement (Current and /or in the community):  No (Comment)  Discharge Needs  Concerns to be addressed:  Adjustment to Illness Readmission within the last 30 days:  No Current discharge risk:  None Barriers to Discharge:  No Barriers Identified   Darden Dates, LCSW 10/12/2015, 11:24 AM

## 2015-10-12 NOTE — Clinical Social Work Placement (Deleted)
   CLINICAL SOCIAL WORK PLACEMENT  NOTE  Date:  10/12/2015  Patient Details  Name: Patrick Chan MRN: [id number] Date of Birth: 04/20/1922  Clinical Social Work is seeking post-discharge placement for this patient at the Skilled  Nursing Facility level of care (*CSW will initial, date and re-position this form in  chart as items are completed):  Yes   Patient/family provided with [place] Clinical Social Work Department's list of facilities offering this level of care within the geographic area requested by the patient (or if unable, by the patient's family).  Yes   Patient/family informed of their freedom to choose among providers that offer the needed level of care, that participate in Medicare, Medicaid or managed care program needed by the patient, have an available bed and are willing to accept the patient.      Patient/family informed of 's ownership interest in [facility] and [facility], as well as of the fact that they are under no obligation to receive care at these facilities.  PASRR submitted to EDS on       PASRR number received on       Existing PASRR number confirmed on 10/12/15     FL2 transmitted to all facilities in geographic area requested by pt/family on 10/12/15     FL2 transmitted to all facilities within larger geographic area on       Patient informed that his/her managed care company has contracts with or will negotiate with certain facilities, including the following:        Yes   Patient/family informed of bed offers received.  Patient chooses bed at [facility]     Physician recommends and patient chooses bed at  [facility])    Patient to be transferred to [facility] on 10/12/15.  Patient to be transferred to facility by Pt's son, Tammy Sours     Patient family notified on 10/12/15 of transfer.  Name of family member notified:  Pt's son, Tammy Sours     PHYSICIAN       Additional Comment:     _______________________________________________ Dede Query, LCSW 10/12/2015, 11:21 AM

## 2015-10-12 NOTE — Clinical Social Work Placement (Signed)
   CLINICAL SOCIAL WORK PLACEMENT  NOTE  Date:  10/12/2015  Patient Details  Name: Patrick Chan MRN: [id number] Date of Birth: [birth year]  Clinical Social Work is seeking post-discharge placement for this patient at the Skilled  Nursing Facility level of care (*CSW will initial, date and re-position this form in  chart as items are completed):  Yes   Patient/family provided with [facility] Clinical Social Work Department's list of facilities offering this level of care within the geographic area requested by the patient (or if unable, by the patient's family).  Yes   Patient/family informed of their freedom to choose among providers that offer the needed level of care, that participate in Medicare, Medicaid or managed care program needed by the patient, have an available bed and are willing to accept the patient.  Yes   Patient/family informed of [facility]'s ownership interest in [facility] and [facility], as well as of the fact that they are under no obligation to receive care at these facilities.  PASRR submitted to EDS on       PASRR number received on       Existing PASRR number confirmed on 10/12/15     FL2 transmitted to all facilities in geographic area requested by pt/family on 10/12/15     FL2 transmitted to all facilities within larger geographic area on       Patient informed that his/her managed care company has contracts with or will negotiate with certain facilities, including the following:        Yes   Patient/family informed of bed offers received.  Patient chooses bed at [facility]     Physician recommends and patient chooses bed at  [facility])    Patient to be transferred to [facility] on 10/12/15.  Patient to be transferred to facility by Pt's son, Tammy Sours     Patient family notified on 10/12/15 of transfer.  Name of family member notified:  Pt's son, Tammy Sours     PHYSICIAN       Additional Comment:     _______________________________________________ Dede Query, LCSW 10/12/2015, 11:21 AM

## 2015-10-13 LAB — GLUCOSE, CAPILLARY: GLUCOSE-CAPILLARY: 135 mg/dL — AB (ref 65–99)

## 2015-10-14 LAB — GLUCOSE, CAPILLARY: Glucose-Capillary: 131 mg/dL — ABNORMAL HIGH (ref 65–99)

## 2015-10-15 LAB — GLUCOSE, CAPILLARY: Glucose-Capillary: 113 mg/dL — ABNORMAL HIGH (ref 65–99)

## 2015-10-16 LAB — CULTURE, BLOOD (ROUTINE X 2)
CULTURE: NO GROWTH
Culture: NO GROWTH

## 2015-10-16 LAB — GLUCOSE, CAPILLARY: GLUCOSE-CAPILLARY: 105 mg/dL — AB (ref 65–99)

## 2015-10-17 LAB — GLUCOSE, CAPILLARY: GLUCOSE-CAPILLARY: 131 mg/dL — AB (ref 65–99)

## 2015-10-18 LAB — GLUCOSE, CAPILLARY: Glucose-Capillary: 124 mg/dL — ABNORMAL HIGH (ref 65–99)

## 2017-06-23 ENCOUNTER — Emergency Department: Payer: Medicare Other

## 2017-06-23 ENCOUNTER — Inpatient Hospital Stay
Admission: EM | Admit: 2017-06-23 | Discharge: 2017-06-27 | DRG: 871 | Disposition: A | Discharge status: Skilled Nursing Facility | Payer: Medicare Other | Attending: Internal Medicine | Admitting: Internal Medicine

## 2017-06-23 DIAGNOSIS — N400 Enlarged prostate without lower urinary tract symptoms: Secondary | ICD-10-CM | POA: Diagnosis present

## 2017-06-23 DIAGNOSIS — Z87891 Personal history of nicotine dependence: Secondary | ICD-10-CM

## 2017-06-23 DIAGNOSIS — R4182 Altered mental status, unspecified: Secondary | ICD-10-CM | POA: Diagnosis present

## 2017-06-23 DIAGNOSIS — Z8249 Family history of ischemic heart disease and other diseases of the circulatory system: Secondary | ICD-10-CM

## 2017-06-23 DIAGNOSIS — Z823 Family history of stroke: Secondary | ICD-10-CM

## 2017-06-23 DIAGNOSIS — Z7984 Long term (current) use of oral hypoglycemic drugs: Secondary | ICD-10-CM | POA: Diagnosis not present

## 2017-06-23 DIAGNOSIS — H9190 Unspecified hearing loss, unspecified ear: Secondary | ICD-10-CM | POA: Diagnosis present

## 2017-06-23 DIAGNOSIS — Z7901 Long term (current) use of anticoagulants: Secondary | ICD-10-CM | POA: Diagnosis not present

## 2017-06-23 DIAGNOSIS — I129 Hypertensive chronic kidney disease with stage 1 through stage 4 chronic kidney disease, or unspecified chronic kidney disease: Secondary | ICD-10-CM | POA: Diagnosis present

## 2017-06-23 DIAGNOSIS — A419 Sepsis, unspecified organism: Secondary | ICD-10-CM | POA: Diagnosis present

## 2017-06-23 DIAGNOSIS — H409 Unspecified glaucoma: Secondary | ICD-10-CM | POA: Diagnosis present

## 2017-06-23 DIAGNOSIS — R0602 Shortness of breath: Secondary | ICD-10-CM

## 2017-06-23 DIAGNOSIS — G40909 Epilepsy, unspecified, not intractable, without status epilepticus: Secondary | ICD-10-CM | POA: Diagnosis present

## 2017-06-23 DIAGNOSIS — Z9660 Presence of unspecified orthopedic joint implant: Secondary | ICD-10-CM | POA: Diagnosis present

## 2017-06-23 DIAGNOSIS — R0902 Hypoxemia: Secondary | ICD-10-CM

## 2017-06-23 DIAGNOSIS — N183 Chronic kidney disease, stage 3 unspecified: Secondary | ICD-10-CM | POA: Diagnosis present

## 2017-06-23 DIAGNOSIS — E1122 Type 2 diabetes mellitus with diabetic chronic kidney disease: Secondary | ICD-10-CM | POA: Diagnosis present

## 2017-06-23 DIAGNOSIS — J189 Pneumonia, unspecified organism: Secondary | ICD-10-CM | POA: Diagnosis present

## 2017-06-23 DIAGNOSIS — R6510 Systemic inflammatory response syndrome (SIRS) of non-infectious origin without acute organ dysfunction: Secondary | ICD-10-CM

## 2017-06-23 DIAGNOSIS — E119 Type 2 diabetes mellitus without complications: Secondary | ICD-10-CM

## 2017-06-23 DIAGNOSIS — N390 Urinary tract infection, site not specified: Secondary | ICD-10-CM | POA: Diagnosis present

## 2017-06-23 LAB — URINALYSIS, COMPLETE (UACMP) WITH MICROSCOPIC
BILIRUBIN URINE: NEGATIVE
Glucose, UA: NEGATIVE mg/dL
Ketones, ur: NEGATIVE mg/dL
Nitrite: NEGATIVE
PH: 6 (ref 5.0–8.0)
Protein, ur: 100 mg/dL — AB
SPECIFIC GRAVITY, URINE: 1.013 (ref 1.005–1.030)
SQUAMOUS EPITHELIAL / LPF: NONE SEEN

## 2017-06-23 LAB — CBC
HEMATOCRIT: 44.9 % (ref 40.0–52.0)
HEMOGLOBIN: 14.9 g/dL (ref 13.0–18.0)
MCH: 29.2 pg (ref 26.0–34.0)
MCHC: 33.2 g/dL (ref 32.0–36.0)
MCV: 88 fL (ref 80.0–100.0)
Platelets: 171 10*3/uL (ref 150–440)
RBC: 5.11 MIL/uL (ref 4.40–5.90)
RDW: 14 % (ref 11.5–14.5)
WBC: 11.3 10*3/uL — AB (ref 3.8–10.6)

## 2017-06-23 LAB — COMPREHENSIVE METABOLIC PANEL
ALT: 27 U/L (ref 17–63)
ANION GAP: 13 (ref 5–15)
AST: 51 U/L — AB (ref 15–41)
Albumin: 3.9 g/dL (ref 3.5–5.0)
Alkaline Phosphatase: 59 U/L (ref 38–126)
BUN: 32 mg/dL — ABNORMAL HIGH (ref 6–20)
CHLORIDE: 100 mmol/L — AB (ref 101–111)
CO2: 25 mmol/L (ref 22–32)
Calcium: 9.3 mg/dL (ref 8.9–10.3)
Creatinine, Ser: 1.44 mg/dL — ABNORMAL HIGH (ref 0.61–1.24)
GFR, EST AFRICAN AMERICAN: 47 mL/min — AB (ref >60)
GFR, EST NON AFRICAN AMERICAN: 40 mL/min — AB (ref >60)
Glucose, Bld: 241 mg/dL — ABNORMAL HIGH (ref 65–99)
POTASSIUM: 4.5 mmol/L (ref 3.5–5.1)
Sodium: 138 mmol/L (ref 135–145)
TOTAL PROTEIN: 7.5 g/dL (ref 6.5–8.1)
Total Bilirubin: 1.2 mg/dL (ref 0.3–1.2)

## 2017-06-23 LAB — TROPONIN I: Troponin I: 0.06 ng/mL (ref <0.03)

## 2017-06-23 LAB — MRSA PCR SCREENING: MRSA by PCR: NEGATIVE

## 2017-06-23 LAB — LACTIC ACID, PLASMA
LACTIC ACID, VENOUS: 4.1 mmol/L — AB (ref 0.5–1.9)
LACTIC ACID, VENOUS: 2.8 mmol/L — AB (ref 0.5–1.9)

## 2017-06-23 LAB — GLUCOSE, CAPILLARY
GLUCOSE-CAPILLARY: 181 mg/dL — AB (ref 65–99)
Glucose-Capillary: 158 mg/dL — ABNORMAL HIGH (ref 65–99)

## 2017-06-23 MED ORDER — SODIUM CHLORIDE 0.9 % IV BOLUS (SEPSIS)
1000.0000 mL | Freq: Once | INTRAVENOUS | Status: AC
  Administered 2017-06-23: 1000 mL via INTRAVENOUS

## 2017-06-23 MED ORDER — TRAZODONE HCL 50 MG PO TABS
50.0000 mg | ORAL_TABLET | Freq: Every day | ORAL | Status: DC
  Administered 2017-06-23 – 2017-06-25 (×3): 50 mg via ORAL
  Filled 2017-06-23 (×3): qty 1

## 2017-06-23 MED ORDER — METOPROLOL TARTRATE 25 MG PO TABS
25.0000 mg | ORAL_TABLET | Freq: Every day | ORAL | Status: DC
  Administered 2017-06-23: 15:00:00 25 mg via ORAL
  Filled 2017-06-23: qty 1

## 2017-06-23 MED ORDER — CITALOPRAM HYDROBROMIDE 20 MG PO TABS
20.0000 mg | ORAL_TABLET | Freq: Every day | ORAL | Status: DC
  Administered 2017-06-23 – 2017-06-27 (×5): 20 mg via ORAL
  Filled 2017-06-23 (×5): qty 1

## 2017-06-23 MED ORDER — HEPARIN SODIUM (PORCINE) 5000 UNIT/ML IJ SOLN
5000.0000 [IU] | Freq: Three times a day (TID) | INTRAMUSCULAR | Status: DC
  Administered 2017-06-23 – 2017-06-24 (×2): 5000 [IU] via SUBCUTANEOUS
  Filled 2017-06-23 (×2): qty 1

## 2017-06-23 MED ORDER — PIPERACILLIN-TAZOBACTAM 3.375 G IVPB 30 MIN
3.3750 g | Freq: Once | INTRAVENOUS | Status: AC
  Administered 2017-06-23: 3.375 g via INTRAVENOUS
  Filled 2017-06-23: qty 50

## 2017-06-23 MED ORDER — MORPHINE SULFATE (PF) 2 MG/ML IV SOLN: 2.0000 mg | INTRAVENOUS | Status: DC | PRN

## 2017-06-23 MED ORDER — LEVETIRACETAM 750 MG PO TABS
750.0000 mg | ORAL_TABLET | Freq: Two times a day (BID) | ORAL | Status: DC
  Administered 2017-06-23 – 2017-06-27 (×8): 750 mg via ORAL
  Filled 2017-06-23 (×9): qty 1

## 2017-06-23 MED ORDER — PANTOPRAZOLE SODIUM 40 MG PO TBEC
40.0000 mg | DELAYED_RELEASE_TABLET | Freq: Every day | ORAL | Status: DC
  Administered 2017-06-23 – 2017-06-27 (×5): 40 mg via ORAL
  Filled 2017-06-23 (×5): qty 1

## 2017-06-23 MED ORDER — IPRATROPIUM-ALBUTEROL 0.5-2.5 (3) MG/3ML IN SOLN
3.0000 mL | Freq: Four times a day (QID) | RESPIRATORY_TRACT | Status: DC
  Administered 2017-06-23 – 2017-06-24 (×5): 3 mL via RESPIRATORY_TRACT
  Filled 2017-06-23 (×5): qty 3

## 2017-06-23 MED ORDER — LORAZEPAM 2 MG/ML IJ SOLN
0.5000 mg | INTRAMUSCULAR | Status: DC | PRN
  Administered 2017-06-23 – 2017-06-25 (×3): 0.5 mg via INTRAVENOUS
  Filled 2017-06-23 (×3): qty 1

## 2017-06-23 MED ORDER — BISACODYL 10 MG RE SUPP
10.0000 mg | Freq: Every day | RECTAL | Status: DC | PRN
  Administered 2017-06-25: 21:00:00 10 mg via RECTAL
  Filled 2017-06-23: qty 1

## 2017-06-23 MED ORDER — INSULIN ASPART 100 UNIT/ML ~~LOC~~ SOLN
0.0000 [IU] | Freq: Three times a day (TID) | SUBCUTANEOUS | Status: DC
  Administered 2017-06-23: 2 [IU] via SUBCUTANEOUS
  Administered 2017-06-24: 1 [IU] via SUBCUTANEOUS
  Administered 2017-06-24: 3 [IU] via SUBCUTANEOUS
  Administered 2017-06-24 – 2017-06-25 (×3): 5 [IU] via SUBCUTANEOUS
  Administered 2017-06-25: 18:00:00 1 [IU] via SUBCUTANEOUS
  Administered 2017-06-26: 3 [IU] via SUBCUTANEOUS
  Administered 2017-06-26: 17:00:00 5 [IU] via SUBCUTANEOUS
  Administered 2017-06-26: 08:00:00 2 [IU] via SUBCUTANEOUS
  Administered 2017-06-27: 1 [IU] via SUBCUTANEOUS
  Filled 2017-06-23 (×11): qty 1

## 2017-06-23 MED ORDER — FINASTERIDE 5 MG PO TABS
5.0000 mg | ORAL_TABLET | Freq: Every day | ORAL | Status: DC
  Administered 2017-06-23 – 2017-06-27 (×5): 5 mg via ORAL
  Filled 2017-06-23 (×5): qty 1

## 2017-06-23 MED ORDER — ACETAMINOPHEN 650 MG RE SUPP: 650.0000 mg | Freq: Four times a day (QID) | RECTAL | Status: DC | PRN

## 2017-06-23 MED ORDER — VANCOMYCIN HCL 10 G IV SOLR
1250.0000 mg | INTRAVENOUS | Status: DC
  Administered 2017-06-23: 1250 mg via INTRAVENOUS
  Filled 2017-06-23 (×2): qty 1250

## 2017-06-23 MED ORDER — VANCOMYCIN HCL IN DEXTROSE 1-5 GM/200ML-% IV SOLN
1000.0000 mg | Freq: Once | INTRAVENOUS | Status: AC
  Administered 2017-06-23: 1000 mg via INTRAVENOUS
  Filled 2017-06-23: qty 200

## 2017-06-23 MED ORDER — LATANOPROST 0.005 % OP SOLN
1.0000 [drp] | Freq: Every day | OPHTHALMIC | Status: DC
  Administered 2017-06-23 – 2017-06-26 (×4): 1 [drp] via OPHTHALMIC
  Filled 2017-06-23: qty 2.5

## 2017-06-23 MED ORDER — VANCOMYCIN HCL 10 G IV SOLR: 1250.0000 mg | INTRAVENOUS | Status: DC

## 2017-06-23 MED ORDER — ACETAMINOPHEN 325 MG PO TABS
650.0000 mg | ORAL_TABLET | Freq: Four times a day (QID) | ORAL | Status: DC | PRN
  Administered 2017-06-25: 22:00:00 650 mg via ORAL
  Filled 2017-06-23: qty 2

## 2017-06-23 MED ORDER — PIPERACILLIN-TAZOBACTAM 3.375 G IVPB
3.3750 g | Freq: Three times a day (TID) | INTRAVENOUS | Status: DC
  Administered 2017-06-23 – 2017-06-24 (×2): 3.375 g via INTRAVENOUS
  Filled 2017-06-23 (×2): qty 50

## 2017-06-23 MED ORDER — ONDANSETRON HCL 4 MG/2ML IJ SOLN: 4.0000 mg | Freq: Four times a day (QID) | INTRAMUSCULAR | Status: DC | PRN

## 2017-06-23 MED ORDER — GLIMEPIRIDE 2 MG PO TABS
2.0000 mg | ORAL_TABLET | Freq: Every day | ORAL | Status: DC
  Administered 2017-06-24 – 2017-06-27 (×4): 2 mg via ORAL
  Filled 2017-06-23 (×5): qty 1

## 2017-06-23 MED ORDER — SODIUM CHLORIDE 0.9 % IV BOLUS (SEPSIS)
500.0000 mL | Freq: Once | INTRAVENOUS | Status: AC
  Administered 2017-06-23: 500 mL via INTRAVENOUS

## 2017-06-23 MED ORDER — SODIUM CHLORIDE 0.9 % IV SOLN
INTRAVENOUS | Status: DC
  Administered 2017-06-23 – 2017-06-24 (×3): via INTRAVENOUS
  Administered 2017-06-25: 75 mL/h via INTRAVENOUS

## 2017-06-23 MED ORDER — DOCUSATE SODIUM 100 MG PO CAPS
100.0000 mg | ORAL_CAPSULE | Freq: Two times a day (BID) | ORAL | Status: DC
  Administered 2017-06-23 – 2017-06-27 (×8): 100 mg via ORAL
  Filled 2017-06-23 (×8): qty 1

## 2017-06-23 MED ORDER — ONDANSETRON HCL 4 MG PO TABS: 4.0000 mg | ORAL_TABLET | Freq: Four times a day (QID) | ORAL | Status: DC | PRN

## 2017-06-23 MED ORDER — APIXABAN 2.5 MG PO TABS
2.5000 mg | ORAL_TABLET | Freq: Two times a day (BID) | ORAL | Status: DC
  Administered 2017-06-23 – 2017-06-27 (×8): 2.5 mg via ORAL
  Filled 2017-06-23 (×8): qty 1

## 2017-06-23 NOTE — Progress Notes (Signed)
LCSW was unable to complete assessment as patient went to sleep. LCSW consulted with RN to answer some assessment questions and will complete remaining assessment tomorrow.  Delta Air LinesClaudine Geovanni Rahming LCSW [phone]

## 2017-06-23 NOTE — Clinical Social Work Note (Addendum)
Clinical Social Work Assessment  Patient Details  Name: Patrick Chan MRN: [id number] Date of Birth: 03/11/1922  Date of referral:  06/23/17               Reason for consult:                   Permission sought to share information with:  Family Supports, Magazine features editoracility Contact Representative Permission granted to share information::  Yes, Verbal Permission Granted  Name::     Patrick Chan [phone] Patrick Chan [phone] HCPOA and POA  Agency::  [facility]  Relationship::     Contact Information:     Housing/Transportation Living arrangements for the past 2 months:  [facility] Source of Information:  Facility, Adult Children, Medical Team Patient Interpreter Needed:  None Criminal Activity/Legal Involvement Pertinent to Current Situation/Hospitalization:  No - Comment as needed Significant Relationships:  Adult Children Lives with:  Facility Resident Do you feel safe going back to the place where you live?  Yes Need for family participation in patient care:  Yes (Comment)  Care giving concerns:  None at this time   Office managerocial Worker assessment / plan:   Patient was asleep and information to answer  assessment questions was provided by his HCPOA/POA son Patrick PapGreg Chan. Patient has resided in independent section of [facility] for last 14 years and as of Tuesday October 30th will be moved to room 232 in the ALF section. Patient is hard of hearing and wears a hearing aid, vision and speech is good. Patient is oriented x4 with some forgetfulness, he is able to communicate his needs verbally. He is able to walk with walker( 2 bad knees) and son just purchased scooter that he can use at ALF. 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 His family feels he now needs increased support that is why they has moved him to ALF to assist with dressing, showering and he feeds himself, good appetite( normal diet) however son mentioned he  is borderline diabetic- no insulin rqd. LCSW will complete fl2, no further needs at this time. Employment status:  Retired Health and safety inspectornsurance information:  Medicare (BCBS supplimental) PT Recommendations:   Not assessed at this time Information / Referral to community resources:   None required  Patient/Family's Response to care:  They are hopeful he will improve  Patient/Family's Understanding of and Emotional Response to Diagnosis, Current Treatment, and Prognosis: Sons understand father is unwell and hopeful he will improve. Emotional Assessment Appearance:  Appears stated age Attitude/Demeanor/Rapport:  Unable to Assess (Patient asleep) Affect (typically observed):  Unable to Assess (patient asleep) Orientation:    Alcohol / Substance use:  Not Applicable Psych involvement (Current and /or in the community):  No (Comment)  Discharge Needs  Concerns to be addressed:  No discharge needs identified Readmission within the last 30 days:  No Current discharge risk:  None Barriers to Discharge:  Continued Medical Work up   Cheron SchaumannBandi, Audrianna Driskill M, LCSW 06/23/2017, 4:15 PM

## 2017-06-23 NOTE — ED Provider Notes (Addendum)
[facility] Emergency Department Provider Note  Time seen: 10:10 AM  I have reviewed the triage vital signs and the nursing notes.   HISTORY  Chief Complaint Fall    HPI Patrick Chan is a 83 y.o. male with a past medical history of depression, diabetes, hypertension, CKD, seizure disorder on Keppra presents to the emergency department after a fall with altered mental status.  According to EMS report patient was found down on the ground or possibly used his life alert to call.  EMS arrived to find the patient on the ground and confused.  Normally the patient lives alone/independently.  Here the patient is confused, believes it is 21.  But is able to tell me that he is at a hospital in his name.  The patient denies any cough, chest pain, abdominal pain or dysuria.  Denies fever although the patient was found to be febrile to 100.4 in the emergency department.  Past Medical History:  Diagnosis Date  . BPH (benign prostatic hyperplasia)   . Depression   . Diabetes mellitus without complication (HCC)   . Hypertension   . Seizures [facility])     Patient Active Problem List   Diagnosis Date Noted  . Acute encephalopathy 10/10/2015  . UTI (lower urinary tract infection) 10/10/2015  . Type 2 diabetes mellitus (HCC) 10/10/2015  . Depression 10/10/2015  . BPH (benign prostatic hyperplasia) 10/10/2015  . HTN (hypertension) 10/10/2015  . OSA on CPAP 10/10/2015  . CKD (chronic kidney disease), stage III (HCC) 10/10/2015  . Acute on chronic kidney failure (HCC) 10/10/2015    Past Surgical History:  Procedure Laterality Date  . JOINT REPLACEMENT      Prior to Admission medications   Medication Sig Start Date End Date Taking? Authorizing Provider  cefUROXime (CEFTIN) 500 MG tablet Take 1 tablet (500 mg total) by mouth 2 (two) times daily with a meal. 10/12/15   Adrian Saran, MD  celecoxib (CELEBREX) 200 MG capsule Take 200 mg by mouth daily.    [provider]  citalopram (CELEXA) 20 MG tablet Take 20 mg by mouth daily.    [provider]  docusate sodium (COLACE) 100 MG capsule Take 100 mg by mouth 2 (two) times daily.    [provider]  finasteride (PROSCAR) 5 MG tablet Take 5 mg by mouth daily.    [provider]  glimepiride (AMARYL) 2 MG tablet Take 2 mg by mouth daily with breakfast.    [provider]  levETIRAcetam (KEPPRA) 750 MG tablet Take 750 mg by mouth 2 (two) times daily.    [provider]  losartan (COZAAR) 50 MG tablet Take 50 mg by mouth daily.    [provider]  metoprolol (LOPRESSOR) 50 MG tablet Take 25 mg by mouth daily.    [provider]  Multiple Vitamin (MULTIVITAMIN WITH MINERALS) TABS tablet Take 1 tablet by mouth daily.    [provider]  traMADol (ULTRAM) 50 MG tablet Take 1 tablet (50 mg total) by mouth every 6 (six) hours as needed for moderate pain. 10/12/15   Houston Siren, MD  traZODone (DESYREL) 50 MG tablet Take 25-50 mg by mouth at bedtime.    [provider]    No Known Allergies  Family History  Problem Relation Age of Onset  . Stroke Mother   . Heart attack Mother     Social History Social History  Substance Use Topics  . Smoking status: Former Games developer  .  Smokeless tobacco: Not on file  . Alcohol use No    Review of Systems Constitutional: Denies fever, positive for fever in the emergency department. Cardiovascular: Negative for chest pain. Respiratory: Negative for shortness of breath. Gastrointestinal: Negative for abdominal pain Genitourinary: Negative for dysuria. Neurological: Negative for headache All other ROS negative  ____________________________________________   PHYSICAL EXAM:  VITAL SIGNS: ED Triage Vitals  Enc Vitals Group     BP 06/23/17 0952 (!) 110/91     Pulse Rate 06/23/17 0952 (!) 101     Resp 06/23/17 0952 (!) 24     Temp 06/23/17 1005 (!) 100.4 F (38 C)      Temp Source 06/23/17 1005 Oral     SpO2 06/23/17 0952 (!) 88 %     Weight 06/23/17 0946 230 lb (104.3 kg)     Height 06/23/17 0946 6\' 2"  (1.88 m)     Head Circumference --      Peak Flow --      Pain Score --      Pain Loc --      Pain Edu? --      Excl. in GC? --    Constitutional: Alert and oriented. Well appearing and in no distress. Eyes: Normal exam ENT   Head: Normocephalic and atraumatic.   Mouth/Throat: Mucous membranes are moist. Cardiovascular: Normal rate, regular rhythm around 100 bpm Respiratory: Normal respiratory effort without tachypnea nor retractions. Breath sounds are clear.  Good air movement bilaterally without obvious wheeze rales or rhonchi. Gastrointestinal: Soft and nontender. No distention.   Musculoskeletal: Nontender with normal range of motion in all extremities.  Neurologic:  Normal speech and language. No gross focal neurologic deficits Skin:  Skin is warm, dry and intact.  Psychiatric: Mood and affect are normal.   ____________________________________________    EKG  EKG reviewed and interpreted by myself shows atrial fibrillation/flutter around 84 bpm.  Otherwise largely normal intervals, left axis deviation, nonspecific ST changes.  ____________________________________________    RADIOLOGY  CT head negative X-ray negative besides atelectasis.  ____________________________________________   INITIAL IMPRESSION / ASSESSMENT AND PLAN / ED COURSE  Pertinent labs & imaging results that were available during my care of the patient were reviewed by me and considered in my medical decision making (see chart for details).  The patient presents to the emergency department after being found down on the ground for an unknown duration.  Patient is confused and found to have a fever in the emergency department as well as hypoxia.  Differential would include sepsis, CVA, pneumonia, urinary tract infection, other infectious etiology, electrolyte  abnormality.  Patient appears to be in new onset atrial fibrillation however it is rate controlled in the 80s-90s currently.  In reviewing the patient's records he was last discharged from the emergency department 10/12/15 after an episode of altered mental status likely due to urinary tract infection.  At that time the patient had a similar presentation to today.  We will check labs, urinalysis, blood cultures.  Given the patient's fever and tachycardia I have initiated sepsis protocols.  We will start broad-spectrum antibiotics while awaiting results.  Patient's results show a slight leukocytosis of 11,300, slight troponin elevation 0.06.  Patient's urinalysis appears grossly infected.  Patient receiving broad-spectrum antibiotics. Given the patient's tachycardia, fever and apparent urinary tract infection patient will require admission for altered mental status likely due to sepsis due to urinary tract infection.  I discussed his plan of care with the patient and family they are  agreeable.  CRITICAL CARE Performed by: Minna Antis   Total critical care time: 30 minutes  Critical care time was exclusive of separately billable procedures and treating other patients.  Critical care was necessary to treat or prevent imminent or life-threatening deterioration.  Critical care was time spent personally by me on the following activities: development of treatment plan with patient and/or surrogate as well as nursing, discussions with consultants, evaluation of patient's response to treatment, examination of patient, obtaining history from patient or surrogate, ordering and performing treatments and interventions, ordering and review of laboratory studies, ordering and review of radiographic studies, pulse oximetry and re-evaluation of patient's condition.  ____________________________________________   FINAL CLINICAL IMPRESSION(S) / ED DIAGNOSES  Confusion Altered mental status Fever UTI    Minna Antis, MD 06/23/17 1126    Minna Antis, MD 06/23/17 1136

## 2017-06-23 NOTE — Progress Notes (Signed)
LCSW received consult for [facility]. Assessment required LCSW will attempt to engage.   Delta Air LinesClaudine Desirae Mancusi LCSW [phone]

## 2017-06-23 NOTE — ED Triage Notes (Signed)
[facility] Pt was found this morning on the floor for unknown amount of time. Per EMS, blood found in brief, unsure where blood came from.  Abrasions and burising noted to right upper shoulder and back. Pt alert, confused, unsure pts baseline mental status. Pt does live alone.

## 2017-06-23 NOTE — ED Notes (Signed)
Date and time results received: 06/23/17 11:41 AM  Test: Lactic Critical Value: 4.1  Name of Provider Notified: Dr. Lenard LancePaduchowski  Orders Received? Or Actions Taken?: Orders Received - See Orders for details

## 2017-06-23 NOTE — H&P (Signed)
History and Physical    Patrick Chan ZOX:[id number] DOB: 1922-06-05 DOA: 06/23/2017  Referring physician: Dr. Lenard Lance PCP: Marguarite Arbour, MD  Specialists: none  Chief Complaint: confusion and weakness  HPI: Patrick Chan is a 83 y.o. male has a past medical history significant for HTN, DM, and seizures, living alone, brought to ER after falling and being found on the floor with weakness and confusion. In the ER, pt was febrile and tachycardic. UA abnormal c/w UTI. Possible sepsis with elevated lactic acid. He is now admitted. Denies CP or SOB. No N/V/D. He is confused in ER. Family present  Review of Systems: The patient denies anorexia, fever, weight loss,, vision loss, decreased hearing, hoarseness, chest pain, syncope, dyspnea on exertion, peripheral edema, balance deficits, hemoptysis, abdominal pain, melena, hematochezia, severe indigestion/heartburn, hematuria, incontinence, genital sores, muscle weakness, suspicious skin lesions, transient blindness, difficulty walking, depression, unusual weight change, abnormal bleeding, enlarged lymph nodes, angioedema, and breast masses.   Past Medical History:  Diagnosis Date  . BPH (benign prostatic hyperplasia)   . Depression   . Diabetes mellitus without complication (HCC)   . Hypertension   . Seizures (HCC)    Past Surgical History:  Procedure Laterality Date  . JOINT REPLACEMENT     Social History:  reports that he has quit smoking. He has never used smokeless tobacco. He reports that he does not drink alcohol or use drugs.  No Known Allergies  Family History  Problem Relation Age of Onset  . Stroke Mother   . Heart attack Mother     Prior to Admission medications   Medication Sig Start Date End Date Taking? Authorizing Provider  apixaban (ELIQUIS) 2.5 MG TABS tablet Take 2.5 mg by mouth 2 (two) times daily.   Yes [provider]  citalopram (CELEXA) 20 MG tablet Take 20 mg by mouth daily.   Yes  [provider]  finasteride (PROSCAR) 5 MG tablet Take 5 mg by mouth daily.   Yes [provider]  glimepiride (AMARYL) 2 MG tablet Take 2 mg by mouth daily with breakfast.   Yes [provider]  levETIRAcetam (KEPPRA) 750 MG tablet Take 750 mg by mouth 2 (two) times daily.   Yes [provider]  LUMIGAN 0.01 % SOLN Place 1 drop into both eyes daily. 05/14/17  Yes [provider]  metoprolol (LOPRESSOR) 50 MG tablet Take 25 mg by mouth daily.   Yes [provider]  Multiple Vitamin (MULTIVITAMIN WITH MINERALS) TABS tablet Take 1 tablet by mouth daily.   Yes [provider]  traZODone (DESYREL) 50 MG tablet Take 50 mg by mouth at bedtime.    Yes [provider]  cefUROXime (CEFTIN) 500 MG tablet Take 1 tablet (500 mg total) by mouth 2 (two) times daily with a meal. Patient not taking: Reported on 06/23/2017 10/12/15   Adrian Saran, MD  docusate sodium (COLACE) 100 MG capsule Take 100 mg by mouth 2 (two) times daily.    [provider]  traMADol (ULTRAM) 50 MG tablet Take 1 tablet (50 mg total) by mouth every 6 (six) hours as needed for moderate pain. Patient not taking: Reported on 06/23/2017 10/12/15   Houston Siren, MD   Physical Exam: Vitals:   06/23/17 1005 06/23/17 1030 06/23/17 1045 06/23/17 1145  BP:  120/73 137/71 130/80  Pulse:  100 (!) 55 (!) 39  Resp:  (!) 34 (!) 25 (!) 21  Temp: (!) 100.4 F (38 C)  TempSrc: Oral     SpO2:  100% 96% 94%  Weight:      Height:         General:  No apparent distress, WDWN, [place]/AT  Eyes: PERRL, EOMI, no scleral icterus, conjunctiva clear  ENT: moist oropharynx without exudate, TM's benign, dentition fair  Neck: supple, no lymphadenopathy. No bruits or thyromegaly  Cardiovascular: regular rate without MRG; 2+ peripheral pulses, no JVD, trace peripheral edema  Respiratory: scattered rhonchi without wheezes or rales. No dullness. Respiratory effort  normal  Abdomen: soft, non tender to palpation, positive bowel sounds, no guarding, no rebound  Skin: no rashes or lesions  Musculoskeletal: normal bulk and tone, no joint swelling  Psychiatric: normal mood and affect, alert and oriented to person and place  Neurologic: CN 2-12 grossly intact, Motor strength 5/5 in all 4 groups with symmetric DTR's and non-focal sensory exam  Labs on Admission:  Basic Metabolic Panel:  Recent Labs Lab 06/23/17 1000  NA 138  K 4.5  CL 100*  CO2 25  GLUCOSE 241*  BUN 32*  CREATININE 1.44*  CALCIUM 9.3   Liver Function Tests:  Recent Labs Lab 06/23/17 1000  AST 51*  ALT 27  ALKPHOS 59  BILITOT 1.2  PROT 7.5  ALBUMIN 3.9   No results for input(s): LIPASE, AMYLASE in the last 168 hours. No results for input(s): AMMONIA in the last 168 hours. CBC:  Recent Labs Lab 06/23/17 1000  WBC 11.3*  HGB 14.9  HCT 44.9  MCV 88.0  PLT 171   Cardiac Enzymes:  Recent Labs Lab 06/23/17 1000  TROPONINI 0.06*    BNP (last 3 results) No results for input(s): BNP in the last 8760 hours.  ProBNP (last 3 results) No results for input(s): PROBNP in the last 8760 hours.  CBG: No results for input(s): GLUCAP in the last 168 hours.  Radiological Exams on Admission: Dg Chest 1 View  Result Date: 06/23/2017 CLINICAL DATA:  Shortness of breath. EXAM: CHEST 1 VIEW COMPARISON:  10/10/2015 FINDINGS: Shallow lung inflation. Tortuous thoracic aorta. There are no focal consolidations or pleural effusions. Mild left lower lobe atelectasis. No pulmonary edema. IMPRESSION: Shallow inflation.  Subsegmental atelectasis in the left lower lobe. Electronically Signed   By: Norva PavlovElizabeth  Brown M.D.   On: 06/23/2017 11:15   Ct Head Wo Contrast  Result Date: 06/23/2017 CLINICAL DATA:  Found down this morning. Right shoulder and back bruising. Confusion. EXAM: CT HEAD WITHOUT CONTRAST TECHNIQUE: Contiguous axial images were obtained from the base of the skull  through the vertex without intravenous contrast. COMPARISON:  CT head 07/16/2014.  MRI brain 07/17/2014. FINDINGS: Brain: There is no evidence of acute intracranial hemorrhage, mass lesion, brain edema or extra-axial fluid collection. The ventricles and subarachnoid spaces are appropriately sized for age. There is no CT evidence of acute cortical infarction. Stable mild chronic small vessel ischemic changes in the periventricular white matter. Vascular: Mild intracranial vascular calcifications. No hyperdense vessel identified. Skull: Negative for fracture or focal lesion. Sinuses/Orbits: The visualized paranasal sinuses and mastoid air cells are clear. No orbital abnormalities are seen. Other: None. IMPRESSION: Stable examination with mild chronic periventricular white matter disease. No acute intracranial findings. Electronically Signed   By: Carey BullocksWilliam  Veazey M.D.   On: 06/23/2017 11:17    EKG: Independently reviewed.  Assessment/Plan Principal Problem:   SIRS (systemic inflammatory response syndrome) (HCC) Active Problems:   Type 2 diabetes mellitus (HCC)   CKD (chronic kidney disease), stage III (HCC)  UTI (urinary tract infection)   Cultures sent. Will admit to floor with IV fluids and IV ABX. Continue outpatient meds for now. Follow sugars. SVN's for bronchodilatation. Consult PT and CSW. Needs placement. Repeat labs in AM. Wean O2 as tolerated  Diet: soft Fluids: NS@75  DVT Prophylaxis: SQ Heparin  Code Status: FULL  Family Communication: yes  Disposition Plan: SNF  Time spent: 50 min

## 2017-06-23 NOTE — Progress Notes (Signed)
Pharmacy Antibiotic Note  Patrick Chan is a 89Adalber[age] Ill5 y.o. male admitted on 06/23/2017 with sepsis.  Pharmacy has been consulted for vancomycin and piperacillin/tazobactam dosing.  Plan: Pipercillin/tazobactam 3.375 g IV q8h EI  Vancomycin 1000 mg dose received in ED. Will order vancomycin 1250 mg IV q24h (6 hour stack) Goal VT 15-20 mcg/mL  Kinetics: Adjusted body weight = 91 kg, CrCl = 41 mL/min Ke: 0.038 Half-life: 18 hrs Vd: 64 L Cmin (estimated) 15 mcg/mL Patient is at risk of accumulation due to obesity.  Height: 6\' 2"  (188 cm) Weight: 230 lb (104.3 kg) IBW/kg (Calculated) : 82.2  Temp (24hrs), Avg:99.9 F (37.7 C), Min:99.3 F (37.4 C), Max:100.4 F (38 C)   Recent Labs Lab 06/23/17 1000  WBC 11.3*  CREATININE 1.44*  LATICACIDVEN 4.1*    Estimated Creatinine Clearance: 41.3 mL/min (A) (by C-G formula based on SCr of 1.44 mg/dL (H)).    No Known Allergies  Antimicrobials this admission: Piperacillin/tazobactam 10/27 >>  vancomycin 10/27 >>   Dose adjustments this admission:  Microbiology results: 10/27 BCx: Sent 10/27 UCx: Sent   Thank you for allowing pharmacy to be a part of this patient's care.  Cindi CarbonMary M Ramsey Guadamuz, PharmD, BCPS Clinical Pharmacist 06/23/2017 1:46 PM

## 2017-06-24 ENCOUNTER — Inpatient Hospital Stay: Payer: Medicare Other

## 2017-06-24 LAB — COMPREHENSIVE METABOLIC PANEL
ALBUMIN: 2.9 g/dL — AB (ref 3.5–5.0)
ALK PHOS: 39 U/L (ref 38–126)
ALT: 23 U/L (ref 17–63)
AST: 42 U/L — AB (ref 15–41)
Anion gap: 7 (ref 5–15)
BILIRUBIN TOTAL: 1.1 mg/dL (ref 0.3–1.2)
BUN: 29 mg/dL — AB (ref 6–20)
CALCIUM: 8.1 mg/dL — AB (ref 8.9–10.3)
CO2: 24 mmol/L (ref 22–32)
CREATININE: 1.39 mg/dL — AB (ref 0.61–1.24)
Chloride: 107 mmol/L (ref 101–111)
GFR calc Af Amer: 49 mL/min — ABNORMAL LOW (ref >60)
GFR calc non Af Amer: 42 mL/min — ABNORMAL LOW (ref >60)
GLUCOSE: 143 mg/dL — AB (ref 65–99)
POTASSIUM: 3.9 mmol/L (ref 3.5–5.1)
Sodium: 138 mmol/L (ref 135–145)
TOTAL PROTEIN: 5.8 g/dL — AB (ref 6.5–8.1)

## 2017-06-24 LAB — GLUCOSE, CAPILLARY
GLUCOSE-CAPILLARY: 145 mg/dL — AB (ref 65–99)
GLUCOSE-CAPILLARY: 231 mg/dL — AB (ref 65–99)
GLUCOSE-CAPILLARY: 258 mg/dL — AB (ref 65–99)
Glucose-Capillary: 148 mg/dL — ABNORMAL HIGH (ref 65–99)
Glucose-Capillary: 296 mg/dL — ABNORMAL HIGH (ref 65–99)

## 2017-06-24 LAB — CBC
HEMATOCRIT: 38.7 % — AB (ref 40.0–52.0)
Hemoglobin: 12.9 g/dL — ABNORMAL LOW (ref 13.0–18.0)
MCH: 29.7 pg (ref 26.0–34.0)
MCHC: 33.3 g/dL (ref 32.0–36.0)
MCV: 89.3 fL (ref 80.0–100.0)
Platelets: 135 10*3/uL — ABNORMAL LOW (ref 150–440)
RBC: 4.33 MIL/uL — ABNORMAL LOW (ref 4.40–5.90)
RDW: 14.3 % (ref 11.5–14.5)
WBC: 6.9 10*3/uL (ref 3.8–10.6)

## 2017-06-24 MED ORDER — DEXTROSE 5 % IV SOLN
1.0000 g | INTRAVENOUS | Status: DC
  Administered 2017-06-24 – 2017-06-26 (×3): 1 g via INTRAVENOUS
  Filled 2017-06-24 (×4): qty 10

## 2017-06-24 MED ORDER — IPRATROPIUM-ALBUTEROL 0.5-2.5 (3) MG/3ML IN SOLN
3.0000 mL | Freq: Four times a day (QID) | RESPIRATORY_TRACT | Status: DC | PRN
  Administered 2017-06-25 – 2017-06-26 (×3): 3 mL via RESPIRATORY_TRACT
  Filled 2017-06-24 (×3): qty 3

## 2017-06-24 NOTE — Progress Notes (Signed)
Pharmacy Antibiotic Note  Patrick Chan is a 83 y.o. male admitted on 06/23/2017 with UTI.  Pharmacy has been consulted for ceftriaxone dosing.  Plan: Ceftriaxone 1 g IV daily  Height: 6\' 2"  (188 cm) Weight: 243 lb (110.2 kg) IBW/kg (Calculated) : 82.2  Temp (24hrs), Avg:98.4 F (36.9 C), Min:97.8 F (36.6 C), Max:98.7 F (37.1 C)   Recent Labs Lab 06/23/17 1000 06/23/17 1338 06/24/17 0349  WBC 11.3*  --  6.9  CREATININE 1.44*  --  1.39*  LATICACIDVEN 4.1* 2.8*  --     Estimated Creatinine Clearance: 43.9 mL/min (A) (by C-G formula based on SCr of 1.39 mg/dL (H)).    No Known Allergies   Thank you for allowing pharmacy to be a part of this patient's care.  Jodelle RedMary M Mellony Danziger 06/24/2017 11:01 AM

## 2017-06-24 NOTE — [place] FL2 (Signed)
[place] MEDICAID FL2 LEVEL OF CARE SCREENING TOOL     IDENTIFICATION  Patient Name: Patrick IllLowell T Court Birthdate: 1922-08-19 Sex: male Admission Date (Current Location): 06/23/2017  [place]   Facility and Address:  [facility], [address], [place], [place] [address]      Provider Number: [id number]  Attending Physician Name and Address:  Adrian SaranMody, Sital, MD  Relative Name and Phone Number:       Current Level of Care: Domiciliary (Rest home) (ALF- [facility]) Recommended Level of Care: Assisted Living Facility Prior Approval Number:    Date Approved/Denied:   PASRR Number: [id number]   Discharge Plan: Domiciliary (Rest home)    Current Diagnoses: Patient Active Problem List   Diagnosis Date Noted  . UTI (urinary tract infection) 06/23/2017  . SIRS (systemic inflammatory response syndrome) (HCC) 06/23/2017  . Acute encephalopathy 10/10/2015  . UTI (lower urinary tract infection) 10/10/2015  . Type 2 diabetes mellitus (HCC) 10/10/2015  . Depression 10/10/2015  . BPH (benign prostatic hyperplasia) 10/10/2015  . HTN (hypertension) 10/10/2015  . OSA on CPAP 10/10/2015  . CKD (chronic kidney disease), stage III (HCC) 10/10/2015  . Acute on chronic kidney failure (HCC) 10/10/2015    Orientation RESPIRATION BLADDER Height & Weight     Self, Time, Situation, Place  Normal Continent Weight: 243 lb (110.2 kg) Height:  6\' 2"  (188 cm)  BEHAVIORAL SYMPTOMS/MOOD NEUROLOGICAL BOWEL NUTRITION STATUS      Continent Diet (Normal)  AMBULATORY STATUS COMMUNICATION OF NEEDS Skin   Independent Verbally Normal            Hard of Hearing           Personal Care Assistance Level of Assistance  Bathing, Feeding, Dressing, Total care Bathing Assistance: Limited assistance Feeding assistance: Independent Dressing Assistance: Limited assistance Total Care Assistance: Limited assistance   Functional Limitations Info   Sight, Hearing, Speech Sight Info: Adequate (wears glasses) Hearing Info: Impaired (7% right ear Left ear 35%) Speech Info: Adequate    SPECIAL CARE FACTORS FREQUENCY   Uses walker and scooter                    Contractures      Additional Factors Info  Code Status Code Status Info: full             Current Medications (06/24/2017):  This is the current hospital active medication list Current Facility-Administered Medications  Medication Dose Route Frequency Provider Last Rate Last Dose  . 0.9 %  sodium chloride infusion   Intravenous Continuous Marguarite ArbourSparks, Jeffrey D, MD 75 mL/hr at 06/24/17 [phone]    . acetaminophen (TYLENOL) tablet 650 mg  650 mg Oral Q6H PRN Marguarite ArbourSparks, Jeffrey D, MD       Or  . acetaminophen (TYLENOL) suppository 650 mg  650 mg Rectal Q6H PRN Marguarite ArbourSparks, Jeffrey D, MD      . apixaban Everlene Balls(ELIQUIS) tablet 2.5 mg  2.5 mg Oral BID Marguarite ArbourSparks, Jeffrey D, MD   2.5 mg at 06/24/17 0943  . bisacodyl (DULCOLAX) suppository 10 mg  10 mg Rectal Daily PRN Marguarite ArbourSparks, Jeffrey D, MD      . citalopram (CELEXA) tablet 20 mg  20 mg Oral Daily Marguarite ArbourSparks, Jeffrey D, MD   20 mg at 06/24/17 0944  . docusate sodium (COLACE) capsule 100 mg  100 mg Oral BID Marguarite ArbourSparks, Jeffrey D, MD   100 mg at 06/24/17 0944  . finasteride (PROSCAR) tablet 5 mg  5 mg Oral Daily Marguarite Arbour, MD   5 mg at 06/24/17 0944  . glimepiride (AMARYL) tablet 2 mg  2 mg Oral Q breakfast Marguarite Arbour, MD   2 mg at 06/24/17 0944  . heparin injection 5,000 Units  5,000 Units Subcutaneous Q8H Marguarite Arbour, MD   5,000 Units at 06/24/17 0537  . insulin aspart (novoLOG) injection 0-9 Units  0-9 Units Subcutaneous TID WC Marguarite Arbour, MD   1 Units at 06/24/17 0940  . ipratropium-albuterol (DUONEB) 0.5-2.5 (3) MG/3ML nebulizer solution 3 mL  3 mL Nebulization QID Marguarite Arbour, MD   3 mL at 06/24/17 0844  . latanoprost (XALATAN) 0.005 % ophthalmic solution 1 drop  1 drop Both Eyes QHS Marguarite Arbour, MD   1 drop at  06/23/17 2215  . levETIRAcetam (KEPPRA) tablet 750 mg  750 mg Oral BID Marguarite Arbour, MD   750 mg at 06/24/17 0948  . LORazepam (ATIVAN) injection 0.5 mg  0.5 mg Intravenous Q4H PRN Marguarite Arbour, MD   0.5 mg at 06/23/17 2239  . morphine 2 MG/ML injection 2 mg  2 mg Intravenous Q2H PRN Marguarite Arbour, MD      . ondansetron [facility]) tablet 4 mg  4 mg Oral Q6H PRN Marguarite Arbour, MD       Or  . ondansetron (ZOFRAN) injection 4 mg  4 mg Intravenous Q6H PRN Marguarite Arbour, MD      . pantoprazole (PROTONIX) EC tablet 40 mg  40 mg Oral Daily Marguarite Arbour, MD   40 mg at 06/24/17 0944  . piperacillin-tazobactam (ZOSYN) IVPB 3.375 g  3.375 g Intravenous Q8H Cindi Carbon, [facility]   Stopped at 06/24/17 5784  . traZODone (DESYREL) tablet 50 mg  50 mg Oral QHS Marguarite Arbour, MD   50 mg at 06/23/17 2218  . vancomycin (VANCOCIN) 1,250 mg in sodium chloride 0.9 % 250 mL IVPB  1,250 mg Intravenous Q24H Cindi Carbon, [facility]   Stopped at 06/23/17 1919     Discharge Medications: Please see discharge summary for a list of discharge medications.  Relevant Imaging Results:  Relevant Lab Results:   Additional Information SSN:  [id number]  Cheron Schaumann, [place]

## 2017-06-24 NOTE — Progress Notes (Signed)
[facility] [place] [facility]   PATIENT NAME: Patrick SamsLowell Deutschman    MR#:  [id number]  DATE OF BIRTH:  1922/06/16  SUBJECTIVE:   Patient hard of hearing Son is in room Feeling better this am Working with PT this am  REVIEW OF SYSTEMS:    Review of Systems  Constitutional: Negative for fever, chills weight loss HENT: Negative for ear pain, nosebleeds, congestion, facial swelling, rhinorrhea, neck pain, neck stiffness and ear discharge.   ++HARD OF HEARING Respiratory: Negative for cough, shortness of breath, wheezing  Cardiovascular: Negative for chest pain, palpitations and leg swelling.  Gastrointestinal: Negative for heartburn, abdominal pain, vomiting, diarrhea or consitpation Genitourinary: Negative for dysuria, urgency, frequency, hematuria Musculoskeletal: Negative for back pain or joint pain Neurological: Negative for dizziness, seizures, syncope, focal weakness,  numbness and headaches.  Hematological: Does not bruise/bleed easily.  Psychiatric/Behavioral: Negative for hallucinations, confusion, dysphoric mood    Tolerating Diet: yes      DRUG ALLERGIES:  No Known Allergies  VITALS:  Blood pressure 128/63, pulse 61, temperature 98.7 F (37.1 C), temperature source Oral, resp. rate 14, height 6\' 2"  (1.88 m), weight 110.2 kg (243 lb), SpO2 97 %.  PHYSICAL EXAMINATION:  Constitutional: Appears well-developed and well-nourished. No distress. HENT: Normocephalic.Marland Kitchen. Oropharynx is clear and moist.  Eyes: Conjunctivae and EOM are normal. PERRLA, no scleral icterus.  Neck: Normal ROM. Neck supple. No JVD. No tracheal deviation. CVS: RRR, S1/S2 +, no murmurs, no gallops, no carotid bruit.  Pulmonary: Effort and breath sounds normal, no stridor, rhonchi, wheezes, rales.  Abdominal: Soft. BS +,  no distension, tenderness, rebound or guarding.  Musculoskeletal: Normal range of motion. No edema and no tenderness.  Neuro: Alert. CN 2-12 grossly intact.  No focal deficits. Skin: Skin is warm and dry. No rash noted. Psychiatric: Normal mood and affect.      LABORATORY PANEL:   CBC  Recent Labs Lab 06/24/17 0349  WBC 6.9  HGB 12.9*  HCT 38.7*  PLT 135*   ------------------------------------------------------------------------------------------------------------------  Chemistries   Recent Labs Lab 06/24/17 0349  NA 138  K 3.9  CL 107  CO2 24  GLUCOSE 143*  BUN 29*  CREATININE 1.39*  CALCIUM 8.1*  AST 42*  ALT 23  ALKPHOS 39  BILITOT 1.1   ------------------------------------------------------------------------------------------------------------------  Cardiac Enzymes  Recent Labs Lab 06/23/17 1000  TROPONINI 0.06*   ------------------------------------------------------------------------------------------------------------------  RADIOLOGY:  Dg Chest 1 View  Result Date: 06/24/2017 CLINICAL DATA:  Hypoxia. EXAM: CHEST 1 VIEW COMPARISON:  06/23/2017 FINDINGS: The cardiac silhouette remains mildly enlarged. Aortic atherosclerosis is noted. Lung volumes remain diminished with interstitial coarsening which is at least partially chronic. There is persistent asymmetric mild patchy opacity in the left lung base, not significantly changed. The minimal atelectasis is noted in the right lung base. No sizable pleural effusion or pneumothorax is identified. A calcified granuloma is noted in the left upper lobe. IMPRESSION: Persistent low lung volumes with basilar atelectasis. Electronically Signed   By: Sebastian AcheAllen  Grady M.D.   On: 06/24/2017 08:15   Dg Chest 1 View  Result Date: 06/23/2017 CLINICAL DATA:  Shortness of breath. EXAM: CHEST 1 VIEW COMPARISON:  10/10/2015 FINDINGS: Shallow lung inflation. Tortuous thoracic aorta. There are no focal consolidations or pleural effusions. Mild left lower lobe atelectasis. No pulmonary edema. IMPRESSION: Shallow inflation.  Subsegmental atelectasis in the left lower lobe.  Electronically Signed   By: Norva PavlovElizabeth  Brown M.D.   On: 06/23/2017 11:15   Ct Head Wo Contrast  Result Date: 06/23/2017 CLINICAL DATA:  Found down this morning. Right shoulder and back bruising. Confusion. EXAM: CT HEAD WITHOUT CONTRAST TECHNIQUE: Contiguous axial images were obtained from the base of the skull through the vertex without intravenous contrast. COMPARISON:  CT head 07/16/2014.  MRI brain 07/17/2014. FINDINGS: Brain: There is no evidence of acute intracranial hemorrhage, mass lesion, brain edema or extra-axial fluid collection. The ventricles and subarachnoid spaces are appropriately sized for age. There is no CT evidence of acute cortical infarction. Stable mild chronic small vessel ischemic changes in the periventricular white matter. Vascular: Mild intracranial vascular calcifications. No hyperdense vessel identified. Skull: Negative for fracture or focal lesion. Sinuses/Orbits: The visualized paranasal sinuses and mastoid air cells are clear. No orbital abnormalities are seen. Other: None. IMPRESSION: Stable examination with mild chronic periventricular white matter disease. No acute intracranial findings. Electronically Signed   By: Carey Bullocks M.D.   On: 06/23/2017 11:17     ASSESSMENT AND PLAN:   83 y/o male with history of diabetes and essential hypertension who was brought to the emergency room after being found on the floor with weakness and confusion.  1.  Sepsis: Patient presents with fever and tachycardia Sepsis due to UTI  2.  Urinary tract infection: Continue Rocephin and follow-up on urine and blood culture  3.  BPH: Continue finasteride  4.  Diabetes: Continue sliding scale, ADA diet and Amaryl  5.  History of seizures: Continue Keppra  6.  History of glaucoma: Continue eyedrops  7.  PAF: Continue anticoagulation with Eliquis  Physical therapy consultation for discharge planning  Patient has a bed on Tuesday at assisted living facility  Management  plans discussed with the patient and son and they are in agreement.  CODE STATUS: Full  TOTAL TIME TAKING CARE OF THIS PATIENT: 30 minutes.     POSSIBLE D/C Tuesday, DEPENDING ON CLINICAL CONDITION.   Ariyon Gerstenberger M.D on 06/24/2017 at 10:38 AM  Between 7am to 6pm - Pager - [phone] After 6pm go to [email or web address] - password [email or web address]  [facility]  Office  [phone]  CC: Primary care physician; Marguarite Arbour, MD  Note: This dictation was prepared with Dragon dictation along with smaller phrase technology. Any transcriptional errors that result from this process are unintentional.

## 2017-06-24 NOTE — Consult Note (Signed)
Reason for Consult: Arrhythmia history of atrial fibrillation Referring Physician: Dr. Georgie Chard primary  Patrick Chan is an 83 y.o. male.  HPI: Patient hypertension diabetes seizure disorder loose in independently and reportedly presented with some confusion. For weakness brought to emergency room found to be febrile with mild tachycardia and irregular rhythm. Patient denied any chest pain no blackout spells or syncope found to have abnormal UA with possible sepsis with lactic acid was treated with fluids and antibiotic therapy and admitted for further evaluation. Cardiology was consulted because of possible arrhythmias and irregularities heart rhythm  Past Medical History:  Diagnosis Date  . BPH (benign prostatic hyperplasia)   . Depression   . Diabetes mellitus without complication ([place])   . Hypertension   . Seizures ([place])     Past Surgical History:  Procedure Laterality Date  . JOINT REPLACEMENT      Family History  Problem Relation Age of Onset  . Stroke Mother   . Heart attack Mother     Social History:  reports that he has quit smoking. He has never used smokeless tobacco. He reports that he does not drink alcohol or use drugs.  Allergies: No Known Allergies  Medications: I have reviewed the patient's current medications.  Results for orders placed or performed during the hospital encounter of 06/23/17 (from the past 48 hour(s))  Comprehensive metabolic panel     Status: Abnormal   Collection Time: 06/23/17 10:00 AM  Result Value Ref Range   Sodium 138 135 - 145 mmol/L   Potassium 4.5 3.5 - 5.1 mmol/L   Chloride 100 (L) 101 - 111 mmol/L   CO2 25 22 - 32 mmol/L   Glucose, Bld 241 (H) 65 - 99 mg/dL   BUN 32 (H) 6 - 20 mg/dL   Creatinine, Ser 1.44 (H) 0.61 - 1.24 mg/dL   Calcium 9.3 8.9 - 10.3 mg/dL   Total Protein 7.5 6.5 - 8.1 g/dL   Albumin 3.9 3.5 - 5.0 g/dL   AST 51 (H) 15 - 41 U/L   ALT 27 17 - 63 U/L   Alkaline Phosphatase 59 38 - 126 U/L   Total  Bilirubin 1.2 0.3 - 1.2 mg/dL   GFR calc non Af Amer 40 (L) >60 mL/min   GFR calc Af Amer 47 (L) >60 mL/min    Comment: (NOTE) The eGFR has been calculated using the CKD EPI equation. This calculation has not been validated in all clinical situations. eGFR's persistently <60 mL/min signify possible Chronic Kidney Disease.    Anion gap 13 5 - 15  CBC     Status: Abnormal   Collection Time: 06/23/17 10:00 AM  Result Value Ref Range   WBC 11.3 (H) 3.8 - 10.6 K/uL   RBC 5.11 4.40 - 5.90 MIL/uL   Hemoglobin 14.9 13.0 - 18.0 g/dL   HCT 44.9 40.0 - 52.0 %   MCV 88.0 80.0 - 100.0 fL   MCH 29.2 26.0 - 34.0 pg   MCHC 33.2 32.0 - 36.0 g/dL   RDW 14.0 11.5 - 14.5 %   Platelets 171 150 - 440 K/uL  Troponin I     Status: Abnormal   Collection Time: 06/23/17 10:00 AM  Result Value Ref Range   Troponin I 0.06 (HH) <0.03 ng/mL    Comment: CRITICAL RESULT CALLED TO, READ BACK BY AND VERIFIED WITH FELICIA STAROPOLI @ 5597 06/23/17 [facility]   CULTURE, BLOOD (ROUTINE X 2) w Reflex to ID Panel  Status: None (Preliminary result)   Collection Time: 06/23/17 10:00 AM  Result Value Ref Range   Specimen Description BLOOD BLOOD RIGHT WRIST    Special Requests      BOTTLES DRAWN AEROBIC AND ANAEROBIC Blood Culture adequate volume   Culture NO GROWTH < 24 HOURS    Report Status PENDING   Lactic acid, plasma     Status: Abnormal   Collection Time: 06/23/17 10:00 AM  Result Value Ref Range   Lactic Acid, Venous 4.1 (HH) 0.5 - 1.9 mmol/L    Comment: CRITICAL RESULT CALLED TO, READ BACK BY AND VERIFIED WITH MEGAN JONES @ 1132 06/23/17 [facility]   Urinalysis, Complete w Microscopic     Status: Abnormal   Collection Time: 06/23/17 10:00 AM  Result Value Ref Range   Color, Urine YELLOW (A) YELLOW   APPearance CLOUDY (A) CLEAR   Specific Gravity, Urine 1.013 1.005 - 1.030   pH 6.0 5.0 - 8.0   Glucose, UA NEGATIVE NEGATIVE mg/dL   Hgb urine dipstick SMALL (A) NEGATIVE   Bilirubin Urine NEGATIVE NEGATIVE    Ketones, ur NEGATIVE NEGATIVE mg/dL   Protein, ur 100 (A) NEGATIVE mg/dL   Nitrite NEGATIVE NEGATIVE   Leukocytes, UA LARGE (A) NEGATIVE   RBC / HPF 0-5 0 - 5 RBC/hpf   WBC, UA TOO NUMEROUS TO COUNT 0 - 5 WBC/hpf   Bacteria, UA FEW (A) NONE SEEN   Squamous Epithelial / LPF NONE SEEN NONE SEEN   WBC Clumps PRESENT   CULTURE, BLOOD (ROUTINE X 2) w Reflex to ID Panel     Status: None (Preliminary result)   Collection Time: 06/23/17 10:01 AM  Result Value Ref Range   Specimen Description BLOOD LEFT ANTECUBITAL    Special Requests      BOTTLES DRAWN AEROBIC AND ANAEROBIC Blood Culture results may not be optimal due to an excessive volume of blood received in culture bottles   Culture NO GROWTH < 24 HOURS    Report Status PENDING   Lactic acid, plasma     Status: Abnormal   Collection Time: 06/23/17  1:38 PM  Result Value Ref Range   Lactic Acid, Venous 2.8 (HH) 0.5 - 1.9 mmol/L    Comment: CRITICAL RESULT CALLED TO, READ BACK BY AND VERIFIED WITH DIEDRA MALCOM @ 1421 06/23/17 [place]   MRSA PCR Screening     Status: None   Collection Time: 06/23/17  2:40 PM  Result Value Ref Range   MRSA by PCR NEGATIVE NEGATIVE    Comment:        The GeneXpert MRSA Assay (FDA approved for NASAL specimens only), is one component of a comprehensive MRSA colonization surveillance program. It is not intended to diagnose MRSA infection nor to guide or monitor treatment for MRSA infections.   Glucose, capillary     Status: Abnormal   Collection Time: 06/23/17  5:03 PM  Result Value Ref Range   Glucose-Capillary 158 (H) 65 - 99 mg/dL  Glucose, capillary     Status: Abnormal   Collection Time: 06/23/17  8:57 PM  Result Value Ref Range   Glucose-Capillary 181 (H) 65 - 99 mg/dL  Glucose, capillary     Status: Abnormal   Collection Time: 06/24/17 12:21 AM  Result Value Ref Range   Glucose-Capillary 145 (H) 65 - 99 mg/dL  Comprehensive metabolic panel     Status: Abnormal   Collection Time: 06/24/17   3:49 AM  Result Value Ref Range   Sodium 138 135 -  145 mmol/L   Potassium 3.9 3.5 - 5.1 mmol/L   Chloride 107 101 - 111 mmol/L   CO2 24 22 - 32 mmol/L   Glucose, Bld 143 (H) 65 - 99 mg/dL   BUN 29 (H) 6 - 20 mg/dL   Creatinine, Ser 1.39 (H) 0.61 - 1.24 mg/dL   Calcium 8.1 (L) 8.9 - 10.3 mg/dL   Total Protein 5.8 (L) 6.5 - 8.1 g/dL   Albumin 2.9 (L) 3.5 - 5.0 g/dL   AST 42 (H) 15 - 41 U/L   ALT 23 17 - 63 U/L   Alkaline Phosphatase 39 38 - 126 U/L   Total Bilirubin 1.1 0.3 - 1.2 mg/dL   GFR calc non Af Amer 42 (L) >60 mL/min   GFR calc Af Amer 49 (L) >60 mL/min    Comment: (NOTE) The eGFR has been calculated using the CKD EPI equation. This calculation has not been validated in all clinical situations. eGFR's persistently <60 mL/min signify possible Chronic Kidney Disease.    Anion gap 7 5 - 15  CBC     Status: Abnormal   Collection Time: 06/24/17  3:49 AM  Result Value Ref Range   WBC 6.9 3.8 - 10.6 K/uL   RBC 4.33 (L) 4.40 - 5.90 MIL/uL   Hemoglobin 12.9 (L) 13.0 - 18.0 g/dL   HCT 38.7 (L) 40.0 - 52.0 %   MCV 89.3 80.0 - 100.0 fL   MCH 29.7 26.0 - 34.0 pg   MCHC 33.3 32.0 - 36.0 g/dL   RDW 14.3 11.5 - 14.5 %   Platelets 135 (L) 150 - 440 K/uL  Glucose, capillary     Status: Abnormal   Collection Time: 06/24/17  7:34 AM  Result Value Ref Range   Glucose-Capillary 148 (H) 65 - 99 mg/dL    Dg Chest 1 View  Result Date: 06/24/2017 CLINICAL DATA:  Hypoxia. EXAM: CHEST 1 VIEW COMPARISON:  06/23/2017 FINDINGS: The cardiac silhouette remains mildly enlarged. Aortic atherosclerosis is noted. Lung volumes remain diminished with interstitial coarsening which is at least partially chronic. There is persistent asymmetric mild patchy opacity in the left lung base, not significantly changed. The minimal atelectasis is noted in the right lung base. No sizable pleural effusion or pneumothorax is identified. A calcified granuloma is noted in the left upper lobe. IMPRESSION: Persistent  low lung volumes with basilar atelectasis. Electronically Signed   By: Logan Bores M.D.   On: 06/24/2017 08:15   Dg Chest 1 View  Result Date: 06/23/2017 CLINICAL DATA:  Shortness of breath. EXAM: CHEST 1 VIEW COMPARISON:  10/10/2015 FINDINGS: Shallow lung inflation. Tortuous thoracic aorta. There are no focal consolidations or pleural effusions. Mild left lower lobe atelectasis. No pulmonary edema. IMPRESSION: Shallow inflation.  Subsegmental atelectasis in the left lower lobe. Electronically Signed   By: Nolon Nations M.D.   On: 06/23/2017 11:15   Ct Head Wo Contrast  Result Date: 06/23/2017 CLINICAL DATA:  Found down this morning. Right shoulder and back bruising. Confusion. EXAM: CT HEAD WITHOUT CONTRAST TECHNIQUE: Contiguous axial images were obtained from the base of the skull through the vertex without intravenous contrast. COMPARISON:  CT head 07/16/2014.  MRI brain 07/17/2014. FINDINGS: Brain: There is no evidence of acute intracranial hemorrhage, mass lesion, brain edema or extra-axial fluid collection. The ventricles and subarachnoid spaces are appropriately sized for age. There is no CT evidence of acute cortical infarction. Stable mild chronic small vessel ischemic changes in the periventricular white matter. Vascular: Mild intracranial  vascular calcifications. No hyperdense vessel identified. Skull: Negative for fracture or focal lesion. Sinuses/Orbits: The visualized paranasal sinuses and mastoid air cells are clear. No orbital abnormalities are seen. Other: None. IMPRESSION: Stable examination with mild chronic periventricular white matter disease. No acute intracranial findings. Electronically Signed   By: Richardean Sale M.D.   On: 06/23/2017 11:17    Review of Systems  Constitutional: Positive for diaphoresis and malaise/fatigue.  HENT: Positive for congestion.   Eyes: Negative.   Respiratory: Positive for shortness of breath.   Cardiovascular: Positive for palpitations and  leg swelling.  Gastrointestinal: Negative.   Genitourinary: Positive for dysuria, frequency and urgency.  Musculoskeletal: Positive for myalgias.  Skin: Negative.   Neurological: Positive for dizziness and weakness.  Endo/Heme/Allergies: Negative.    Blood pressure 128/63, pulse 63, temperature 98.7 F (37.1 C), temperature source Oral, resp. rate 18, height 6' 2"  (1.88 m), weight 243 lb (110.2 kg), SpO2 95 %. Physical Exam  Nursing note and vitals reviewed. Constitutional: He is oriented to person, place, and time. He appears well-developed and well-nourished.  HENT:  Head: Normocephalic and atraumatic.  Right Ear: Decreased hearing is noted.  Left Ear: Decreased hearing is noted.  Eyes: Pupils are equal, round, and reactive to light. Conjunctivae and EOM are normal.  Neck: Normal range of motion. Neck supple.  Cardiovascular: Normal rate, S1 normal and S2 normal.  An irregularly irregular rhythm present. PMI is displaced.  Exam reveals gallop and distant heart sounds.   Murmur heard.  Systolic murmur is present with a grade of 2/6  Respiratory: Effort normal and breath sounds normal.  Musculoskeletal: Normal range of motion.  Neurological: He is alert and oriented to person, place, and time. He has normal reflexes.  Skin: Skin is warm and dry.  Psychiatric: He has a normal mood and affect.    Assessment/Plan: Urinary tract infection Diabetes Hypertension Seizure disorder Possible sepsis Delusional confusion Atrial fibrillation Chronic renal insufficiency Possible arrhythmia PACs sinus arrhythmia . Plan Agree with broad-spectrum antibiotic therapy for UTI Recommend hydration Continue hypertension control Agree with diabetes management Maintain medications for seizure disorder Continue follow-up and evaluation for disorientation and confusion Agree with aggressive fluid resuscitation therapy for possible sepsis Follow-up with nephrology for renal  insufficiency Recommend conservative cardiac therapy  Kristen Bushway D Giulietta Prokop 06/24/2017, 11:45 AM

## 2017-06-24 NOTE — Progress Notes (Signed)
LCSW met with patient and his son Patrick Chan who is his HCPOA and POA LCSWcollected data to complete assessment.  Patient has been living at [facility] for 14 years and will move on Tuesday October 30th/2018 to ALF section of [place] Room 232. NOTE Patient HOH Hard of hearing  7% hearing in right ear and 35% in left ear and only uses hearing aid in left ear.  Completed Assessment and Fl2 started.  [employer] LCSW [phone]

## 2017-06-24 NOTE — Progress Notes (Signed)
Physical Therapy Evaluation Patient Details Name: Patrick Chan MRN: [id number] DOB: [birth year] Today's Date: 06/24/2017   History of Present Illness  Patient is a 83 y.o. male admitted on 23 Jun 2017 after being found on floor at ILF weak and confused. Found to have a UTI. PMH includes HTN, DMII, seizures, and HOH.  Clinical Impression  Patient is a pleasant male admitted for above listed reasons who is very HOH. On evaluation, he demonstrates bed mobility with minimal assistance but required maximal assistance into standing. PT assessed O2 saturations and HR throughout mobility assessment without 4L O2, noting vitals staying within normal limits. Dr. Juliene PinaMody assessed lung sounds during evaluation, as well. Patient required maximal assistance and 3 attempts to stand without impulsively grabbing rails of chair, leaning L or posteriorly. Once demonstrating proper technique, patient was able to transfer to chair with increased ease; however, he still plops into the chair impulsively. Patient will benefit from continued functional strengthening to return to PLOF and allow safe transition into ALF when medically ready.    Follow Up Recommendations SNF    Equipment Recommendations  None recommended by PT    Recommendations for Other Services       Precautions / Restrictions Precautions Precautions: Fall Restrictions Weight Bearing Restrictions: No      Mobility  Bed Mobility Overal bed mobility: Needs Assistance Bed Mobility: Supine to Sit     Supine to sit: Min assist;HOB elevated     General bed mobility comments: Patient moves from supine to sit with minimal assistance.  Transfers Overall transfer level: Needs assistance Equipment used: Rolling walker (2 wheeled) Transfers: Sit to/from [employer];Stand Pivot Transfers Sit to Stand: Max assist;From elevated surface Stand pivot transfers: Min assist       General transfer comment: Patient required 3 attempts to perform sit to  stand with maximal assistance. Has tendency to reach for other objects to stabilize. Responded better to demonstration for how to properly transfer to chair. Impulsively will sit without cues.  Ambulation/Gait                Stairs            Wheelchair Mobility    Modified Rankin (Stroke Patients Only)       Balance Overall balance assessment: Needs assistance;History of Falls Sitting-balance support: Feet supported Sitting balance-Leahy Scale: Fair     Standing balance support: Bilateral upper extremity supported Standing balance-Leahy Scale: Poor                               Pertinent Vitals/Pain Pain Assessment: No/denies pain    Home Living Family/patient expects to be discharged to:: Assisted living               Home Equipment: Walker - 2 wheels;Electric scooter      Prior Function Level of Independence: Independent with assistive device(s)         Comments: Patient previously independent at ILF with RW and electric scooter. Per son, patient was going to transfer to ALF, as he has had more falls in past.     Hand Dominance        Extremity/Trunk Assessment   Upper Extremity Assessment Upper Extremity Assessment: Overall WFL for tasks assessed;Generalized weakness    Lower Extremity Assessment Lower Extremity Assessment: Generalized weakness       Communication   Communication: HOH  Cognition Arousal/Alertness: Awake/alert Behavior During Therapy: WFL for tasks assessed/performed;Impulsive  Overall Cognitive Status: Within Functional Limits for tasks assessed                                        General Comments      Exercises     Assessment/Plan    PT Assessment Patient needs continued PT services  PT Problem List Decreased strength;Decreased range of motion;Decreased activity tolerance;Decreased balance;Decreased mobility;Decreased knowledge of use of DME;Decreased safety awareness        PT Treatment Interventions DME instruction;Gait training;Functional mobility training;Therapeutic activities;Therapeutic exercise;Balance training;Patient/family education    PT Goals (Current goals can be found in the Care Plan section)  Acute Rehab PT Goals Patient Stated Goal: Unstated PT Goal Formulation: With patient/family Time For Goal Achievement: 07/08/17 Potential to Achieve Goals: Fair    Frequency Min 2X/week   Barriers to discharge Inaccessible home environment;Decreased caregiver support      Co-evaluation               AM-PAC PT "6 Clicks" Daily Activity  Outcome Measure Difficulty turning over in bed (including adjusting bedclothes, sheets and blankets)?: Unable Difficulty moving from lying on back to sitting on the side of the bed? : Unable Difficulty sitting down on and standing up from a chair with arms (e.g., wheelchair, bedside commode, etc,.)?: Unable Help needed moving to and from a bed to chair (including a wheelchair)?: A Lot Help needed walking in hospital room?: Total Help needed climbing 3-5 steps with a railing? : Total 6 Click Score: 7    End of Session Equipment Utilized During Treatment: Gait belt Activity Tolerance: Patient tolerated treatment well;Patient limited by fatigue Patient left: in chair;with call bell/phone within reach;with chair alarm set;with family/visitor present   PT Visit Diagnosis: Unsteadiness on feet (R26.81);Repeated falls (R29.6);Muscle weakness (generalized) (M62.81);Difficulty in walking, not elsewhere classified (R26.2)    Time: 1610-9604 PT Time Calculation (min) (ACUTE ONLY): 29 min   Charges:   PT Evaluation $PT Eval Low Complexity: 1 Low     PT G Codes:   PT G-Codes **NOT FOR INPATIENT CLASS** Functional Assessment Tool Used: AM-PAC 6 Clicks Basic Mobility;Clinical judgement Functional Limitation: Mobility: Walking and moving around Mobility: Walking and Moving Around Current Status (V4098): At  least 80 percent but less than 100 percent impaired, limited or restricted Mobility: Walking and Moving Around Goal Status [phone]): At least 80 percent but less than 100 percent impaired, limited or restricted      Neita Carp, PT, DPT 06/24/2017, 10:34 AM

## 2017-06-25 ENCOUNTER — Encounter
Admission: RE | Admit: 2017-06-25 | Discharge: 2017-06-25 | Disposition: A | Discharge status: Home / Self Care | Payer: Medicare Other | Source: Ambulatory Visit | Attending: Internal Medicine | Admitting: Internal Medicine

## 2017-06-25 LAB — GLUCOSE, CAPILLARY
GLUCOSE-CAPILLARY: 274 mg/dL — AB (ref 65–99)
GLUCOSE-CAPILLARY: 276 mg/dL — AB (ref 65–99)
GLUCOSE-CAPILLARY: 135 mg/dL — AB (ref 65–99)
Glucose-Capillary: 170 mg/dL — ABNORMAL HIGH (ref 65–99)
Glucose-Capillary: 144 mg/dL — ABNORMAL HIGH (ref 65–99)

## 2017-06-25 LAB — BASIC METABOLIC PANEL
ANION GAP: 6 (ref 5–15)
BUN: 27 mg/dL — ABNORMAL HIGH (ref 6–20)
CALCIUM: 8 mg/dL — AB (ref 8.9–10.3)
CHLORIDE: 109 mmol/L (ref 101–111)
CO2: 24 mmol/L (ref 22–32)
CREATININE: 1.3 mg/dL — AB (ref 0.61–1.24)
GFR calc non Af Amer: 46 mL/min — ABNORMAL LOW (ref >60)
GFR, EST AFRICAN AMERICAN: 53 mL/min — AB (ref >60)
Glucose, Bld: 182 mg/dL — ABNORMAL HIGH (ref 65–99)
Potassium: 3.8 mmol/L (ref 3.5–5.1)
SODIUM: 139 mmol/L (ref 135–145)

## 2017-06-25 LAB — CBC
HCT: 36.8 % — ABNORMAL LOW (ref 40.0–52.0)
HEMOGLOBIN: 12.7 g/dL — AB (ref 13.0–18.0)
MCH: 30.1 pg (ref 26.0–34.0)
MCHC: 34.4 g/dL (ref 32.0–36.0)
MCV: 87.4 fL (ref 80.0–100.0)
PLATELETS: 136 10*3/uL — AB (ref 150–440)
RBC: 4.21 MIL/uL — AB (ref 4.40–5.90)
RDW: 14.3 % (ref 11.5–14.5)
WBC: 6.9 10*3/uL (ref 3.8–10.6)

## 2017-06-25 LAB — LACTIC ACID, PLASMA: LACTIC ACID, VENOUS: 1.1 mmol/L (ref 0.5–1.9)

## 2017-06-25 MED ORDER — METOPROLOL TARTRATE 25 MG PO TABS
25.0000 mg | ORAL_TABLET | Freq: Every day | ORAL | Status: DC
  Administered 2017-06-25 – 2017-06-27 (×3): 25 mg via ORAL
  Filled 2017-06-25 (×3): qty 1

## 2017-06-25 MED ORDER — INSULIN ASPART 100 UNIT/ML ~~LOC~~ SOLN
3.0000 [IU] | Freq: Three times a day (TID) | SUBCUTANEOUS | Status: DC
  Administered 2017-06-26 – 2017-06-27 (×5): 3 [IU] via SUBCUTANEOUS
  Filled 2017-06-25 (×5): qty 1

## 2017-06-25 NOTE — Progress Notes (Signed)
[facility] [place] [facility]   PATIENT NAME: Patrick Chan    MR#:  [id number]  DATE OF BIRTH:  [birth year]  SUBJECTIVE:   Patient hard of hearing He has no complaint, feel better.  REVIEW OF SYSTEMS:    Review of Systems  Constitutional: Negative for fever, chills weight loss HENT: Negative for ear pain, nosebleeds, congestion, facial swelling, rhinorrhea, neck pain, neck stiffness and ear discharge.   ++HARD OF HEARING Respiratory: Negative for cough, shortness of breath, wheezing  Cardiovascular: Negative for chest pain, palpitations and leg swelling.  Gastrointestinal: Negative for heartburn, abdominal pain, vomiting, diarrhea or consitpation Genitourinary: Negative for dysuria, urgency, frequency, hematuria Musculoskeletal: Negative for back pain or joint pain Neurological: Negative for dizziness, seizures, syncope, focal weakness,  numbness and headaches.  Hematological: Does not bruise/bleed easily.  Psychiatric/Behavioral: Negative for hallucinations, confusion, dysphoric mood DRUG ALLERGIES:  No Known Allergies  VITALS:  Blood pressure (!) 155/85, pulse (!) 112, temperature 98.4 F (36.9 C), temperature source Oral, resp. rate (!) 34, height 6\' 2"  (1.88 m), weight 243 lb (110.2 kg), SpO2 94 %.  PHYSICAL EXAMINATION:  Constitutional: Appears well-developed and well-nourished. No distress. HENT: Normocephalic.Marland Kitchen. Oropharynx is clear and moist.  Eyes: Conjunctivae and EOM are normal. PERRLA, no scleral icterus.  Neck: Normal ROM. Neck supple. No JVD. No tracheal deviation. CVS: RRR, S1/S2 +, no murmurs, no gallops, no carotid bruit.  Pulmonary: Effort and breath sounds normal, no stridor, rhonchi, wheezes, rales.  Abdominal: Soft. BS +,  no distension, tenderness, rebound or guarding.  Musculoskeletal: Normal range of motion. No edema and no tenderness.  Neuro: Alert. CN 2-12 grossly intact. No focal deficits. Skin: Skin is warm and dry. No rash  noted. Psychiatric: Normal mood and affect.  LABORATORY PANEL:   CBC  Recent Labs Lab 06/25/17 0410  WBC 6.9  HGB 12.7*  HCT 36.8*  PLT 136*   ------------------------------------------------------------------------------------------------------------------  Chemistries   Recent Labs Lab 06/24/17 0349 06/25/17 0410  NA 138 139  K 3.9 3.8  CL 107 109  CO2 24 24  GLUCOSE 143* 182*  BUN 29* 27*  CREATININE 1.39* 1.30*  CALCIUM 8.1* 8.0*  AST 42*  --   ALT 23  --   ALKPHOS 39  --   BILITOT 1.1  --    ------------------------------------------------------------------------------------------------------------------  Cardiac Enzymes  Recent Labs Lab 06/23/17 1000  TROPONINI 0.06*   ------------------------------------------------------------------------------------------------------------------  RADIOLOGY:  Dg Chest 1 View  Result Date: 06/24/2017 CLINICAL DATA:  Hypoxia. EXAM: CHEST 1 VIEW COMPARISON:  06/23/2017 FINDINGS: The cardiac silhouette remains mildly enlarged. Aortic atherosclerosis is noted. Lung volumes remain diminished with interstitial coarsening which is at least partially chronic. There is persistent asymmetric mild patchy opacity in the left lung base, not significantly changed. The minimal atelectasis is noted in the right lung base. No sizable pleural effusion or pneumothorax is identified. A calcified granuloma is noted in the left upper lobe. IMPRESSION: Persistent low lung volumes with basilar atelectasis. Electronically Signed   By: Sebastian AcheAllen  Grady M.D.   On: 06/24/2017 08:15     ASSESSMENT AND PLAN:   83 y/o male with history of diabetes and essential hypertension who was brought to the emergency room after being found on the floor with weakness and confusion.  1.  Sepsis: Patient presents with fever and tachycardia Sepsis due to UTI, improved.  2.  Urinary tract infection:  Continue Rocephin and follow-up on urine (PROTEUS MIRABILIS), f/u  sensitivity and blood culture is negative so  far.  3.  BPH: Continue finasteride  4.  Diabetes: Continue sliding scale, ADA diet and Amaryl  5.  History of seizures: Continue Keppra  6.  History of glaucoma: Continue eyedrops  7.  PAF: Continue anticoagulation with Eliquis  Physical therapy consultation for discharge planning  Patient has a bed on Tuesday at assisted living facility, but per PT: SNF.  Management plans discussed with the patient and son and they are in agreement.  CODE STATUS: Full  TOTAL TIME TAKING CARE OF THIS PATIENT: 30 minutes.   POSSIBLE D/C Tomorrow, DEPENDING ON CLINICAL CONDITION.   [name] M.D on 06/25/2017 at 12:15 PM  Between 7am to 6pm - Pager - [phone] After 6pm go to [email or web address] - password [employer]  [facility]  Office  [phone]  CC: Primary care physician; Marguarite Arbour, MD  Note: This dictation was prepared with Dragon dictation along with smaller phrase technology. Any transcriptional errors that result from this process are unintentional.

## 2017-06-25 NOTE — Care Management Important Message (Signed)
Important Message  Patient Details  Name: Patrick Chan MRN: [id number] Date of Birth: 04/12/1922   Medicare Important Message Given:  Yes    Gwenette GreetBrenda S Zuly Belkin, RN 06/25/2017, 9:57 AM

## 2017-06-25 NOTE — Care Management Note (Signed)
Case Management Note  Patient Details  Name: Patrick IllLowell T Lodato MRN: [id number] Date of Birth: [birth year]  Subjective/Objective:  [facility] with the diagnosis of systemic inflammatory response syndrome[facility].  Son is Tammy SoursGreg [phone]). Last seen Dr. Judithann SheenSparks 04/04/17. Hard of hearing. Rolling walker in the home. States he does have help that comes into his [facility]                     Action/Plan: Physical therapy evaluation completed.   [facility] per Dr. Imogene Burnhen, if medically stable   Expected Discharge Date:                  Expected Discharge Plan:     In-House Referral:     Discharge planning Services     Post Acute Care Choice:    Choice offered to:     DME Arranged:    DME Agency:     HH Arranged:    HH Agency:     Status of Service:     If discussed at Long Length of Stay Meetings, dates discussed:    Additional Comments:  Gwenette GreetBrenda S Maddyson Keil, RN MSN CCM Care Management [phone] 06/25/2017, 10:21 AM

## 2017-06-25 NOTE — Progress Notes (Signed)
Dr. Elpidio AnisSudini notified of pt temp of 101.3 axillary. Per MD given tylenol. No need to repeat [facility]

## 2017-06-25 NOTE — Progress Notes (Signed)
Inpatient Diabetes Program Recommendations  AACE/ADA: New Consensus Statement on Inpatient Glycemic Control (2015)  Target Ranges:  Prepandial:   less than 140 mg/dL      Peak postprandial:   less than 180 mg/dL (1-2 hours)      Critically ill patients:  140 - 180 mg/dL   Results for Patrick Chan, Patrick Chan (MRN [id number]) as of 06/25/2017 10:18  Ref. Range 06/24/2017 07:34 06/24/2017 11:44 06/24/2017 17:07 06/24/2017 21:14 06/25/2017 07:52 06/25/2017 09:31  Glucose-Capillary Latest Ref Range: 65 - 99 mg/dL 086148 (H) 578258 (H) 469231 (H) 296 (H) 170 (H) 274 (H)   Review of Glycemic Control  Diabetes history: DM2 Outpatient Diabetes medications: Amaryl 2 mg QAM Current orders for Inpatient glycemic control: Amaryl 2 mg QAM, Novolog 0-9 units TID with meals  Inpatient Diabetes Program Recommendations: Correction (SSI): Please consider ordering Novolog 0-5 units QHS. Insulin - Meal Coverage: Please consider ordering Novolog 3 units TID with meals for meal coverage if patient eats at least 50% of meals.  Thanks, Orlando PennerMarie Dax Murguia, RN, MSN, CDE Diabetes Coordinator Inpatient Diabetes Program [phone] (Team Pager from 8am to 5pm)

## 2017-06-26 ENCOUNTER — Inpatient Hospital Stay: Payer: Medicare Other

## 2017-06-26 LAB — URINE CULTURE: Culture: 80000 — AB

## 2017-06-26 LAB — CBC WITH DIFFERENTIAL/PLATELET
BASOS ABS: 0.1 10*3/uL (ref 0–0.1)
BASOS PCT: 1 %
EOS PCT: 2 %
Eosinophils Absolute: 0.2 10*3/uL (ref 0–0.7)
HEMATOCRIT: 40 % (ref 40.0–52.0)
Hemoglobin: 13.3 g/dL (ref 13.0–18.0)
Lymphocytes Relative: 15 %
Lymphs Abs: 1.3 10*3/uL (ref 1.0–3.6)
MCH: 29.6 pg (ref 26.0–34.0)
MCHC: 33.4 g/dL (ref 32.0–36.0)
MCV: 88.8 fL (ref 80.0–100.0)
MONO ABS: 0.9 10*3/uL (ref 0.2–1.0)
MONOS PCT: 10 %
NEUTROS ABS: 6.1 10*3/uL (ref 1.4–6.5)
Neutrophils Relative %: 72 %
PLATELETS: 157 10*3/uL (ref 150–440)
RBC: 4.5 MIL/uL (ref 4.40–5.90)
RDW: 14.3 % (ref 11.5–14.5)
WBC: 8.6 10*3/uL (ref 3.8–10.6)

## 2017-06-26 LAB — BASIC METABOLIC PANEL
ANION GAP: 7 (ref 5–15)
BUN: 25 mg/dL — AB (ref 6–20)
CALCIUM: 8.6 mg/dL — AB (ref 8.9–10.3)
CO2: 24 mmol/L (ref 22–32)
Chloride: 109 mmol/L (ref 101–111)
Creatinine, Ser: 1.31 mg/dL — ABNORMAL HIGH (ref 0.61–1.24)
GFR calc Af Amer: 52 mL/min — ABNORMAL LOW (ref >60)
GFR calc non Af Amer: 45 mL/min — ABNORMAL LOW (ref >60)
Glucose, Bld: 152 mg/dL — ABNORMAL HIGH (ref 65–99)
POTASSIUM: 3.8 mmol/L (ref 3.5–5.1)
SODIUM: 140 mmol/L (ref 135–145)

## 2017-06-26 LAB — GLUCOSE, CAPILLARY
GLUCOSE-CAPILLARY: 172 mg/dL — AB (ref 65–99)
GLUCOSE-CAPILLARY: 275 mg/dL — AB (ref 65–99)
GLUCOSE-CAPILLARY: 271 mg/dL — AB (ref 65–99)
Glucose-Capillary: 232 mg/dL — ABNORMAL HIGH (ref 65–99)

## 2017-06-26 MED ORDER — METHYLPREDNISOLONE SODIUM SUCC 125 MG IJ SOLR
60.0000 mg | Freq: Once | INTRAMUSCULAR | Status: AC
  Administered 2017-06-26: 60 mg via INTRAVENOUS
  Filled 2017-06-26: qty 2

## 2017-06-26 NOTE — Progress Notes (Signed)
Advanced Care Plan.  Purpose of Encounter: CODE STATUS. Parties in Attendance: the patient,his son (POA), Charity fundraiserN and me. Patient's Decisional Capacity: No. Medical Story: 83 y/o male with history of diabetes and essential hypertension who was brought to the emergency room after being found on the floor with weakness and confusion. He is diagnosis with sepsis due to UTI and has been treated with antibiotics. I discussed his CODE STATUS with the patient's son. His son said that the patient's wish is that he wants to try to resuscitation and intubation.  Plan:  Code Status: full code. Time spent discussing advance care planning: 18 minutes.

## 2017-06-26 NOTE — Progress Notes (Signed)
[facility] [place] [facility]   PATIENT NAME: Patrick SamsLowell Chan    MR#:  [id number]  DATE OF BIRTH:  02/06/1922  SUBJECTIVE:   The patient had fever 101,3 last night, she has shortness breath and wheezing per RN. The patient is confused. REVIEW OF SYSTEMS:    Review of Systems  Constitutional: Negative for fever, chills weight loss HENT: Negative for ear pain, nosebleeds, congestion, facial swelling, rhinorrhea, neck pain, neck stiffness and ear discharge.   ++HARD OF HEARING Respiratory: Negative for cough, shortness of breath, wheezing  Cardiovascular: Negative for chest pain, palpitations and leg swelling.  Gastrointestinal: Negative for heartburn, abdominal pain, vomiting, diarrhea or consitpation Genitourinary: Negative for dysuria, urgency, frequency, hematuria Musculoskeletal: Negative for back pain or joint pain Neurological: Negative for dizziness, seizures, syncope, focal weakness,  numbness and headaches.  Hematological: Does not bruise/bleed easily.  Psychiatric/Behavioral: Negative for hallucinations, confusion, dysphoric mood DRUG ALLERGIES:  No Known Allergies  VITALS:  Blood pressure 133/65, pulse 77, temperature 97.7 F (36.5 C), temperature source Oral, resp. rate (!) 24, height 6\' 2"  (1.88 m), weight 243 lb (110.2 kg), SpO2 98 %.  PHYSICAL EXAMINATION:  Constitutional: Appears well-developed and well-nourished. No distress. HENT: Normocephalic.Marland Kitchen. Oropharynx is clear and moist.  Eyes: Conjunctivae and EOM are normal. PERRLA, no scleral icterus.  Neck: Normal ROM. Neck supple. No JVD. No tracheal deviation. CVS: RRR, S1/S2 +, no murmurs, no gallops, no carotid bruit.  Pulmonary: no stridor, rhonchi, but has mild wheezing and Tachypnea. Abdominal: Soft. BS +,  no distension, tenderness, rebound or guarding.  Musculoskeletal: Normal range of motion. No edema and no tenderness.  Neuro: Alert. CN 2-12 grossly intact. No focal deficits. Skin:  Skin is warm and dry. No rash noted. Psychiatric: Normal mood and affect.  LABORATORY PANEL:   CBC  Recent Labs Lab 06/26/17 0154  WBC 8.6  HGB 13.3  HCT 40.0  PLT 157   ------------------------------------------------------------------------------------------------------------------  Chemistries   Recent Labs Lab 06/24/17 0349  06/26/17 0154  NA 138  < > 140  K 3.9  < > 3.8  CL 107  < > 109  CO2 24  < > 24  GLUCOSE 143*  < > 152*  BUN 29*  < > 25*  CREATININE 1.39*  < > 1.31*  CALCIUM 8.1*  < > 8.6*  AST 42*  --   --   ALT 23  --   --   ALKPHOS 39  --   --   BILITOT 1.1  --   --   < > = values in this interval not displayed. ------------------------------------------------------------------------------------------------------------------  Cardiac Enzymes  Recent Labs Lab 06/23/17 1000  TROPONINI 0.06*   ------------------------------------------------------------------------------------------------------------------  RADIOLOGY:  Dg Chest Port 1 View  Result Date: 06/26/2017 CLINICAL DATA:  83 year old male with sudden onset shortness of breath. EXAM: PORTABLE CHEST 1 VIEW COMPARISON:  Chest radiograph dated 06/24/2017 FINDINGS: An area of streaky density at the left lung base may represent atelectasis/ scarring or infiltrate. Clinical correlation is recommended. There is no significant pleural effusion. No pneumothorax. Mild cardiomegaly. There is atherosclerotic calcification of the aortic arch. No acute osseous pathology. IMPRESSION: Left lung base atelectasis/ scarring versus infiltrate. Clinical correlation is recommended. Electronically Signed   By: Elgie CollardArash  Radparvar M.D.   On: 06/26/2017 02:43     ASSESSMENT AND PLAN:   83 y/o male with history of diabetes and essential hypertension who was brought to the emergency room after being found on the floor with weakness  and confusion.  1.  Sepsis: Patient presents with fever and tachycardia Sepsis due to  UTI, improved.  2.  Urinary tract infection:  Continue Rocephin and follow-up on urine (PROTEUS MIRABILIS), ensitive to Rocephin and blood culture is negative so far.  3.  BPH: Continue finasteride  4.  Diabetes: Continue sliding scale, ADA diet and Amaryl  5.  History of seizures: Continue Keppra  6.  History of glaucoma: Continue eyedrops   7.  PAF: Continue anticoagulation with Eliquis  Fever, Shortness of breath and wheezing. Chest x-ray today only shows atelectasis. DuoNeb when necessary. Tylenol when necessary.  Per PT: SNF.  Management plans discussed with the patient and son and they are in agreement.  CODE STATUS: Full  TOTAL TIME TAKING CARE OF THIS PATIENT: 30 minutes.   POSSIBLE D/C Tomorrow, DEPENDING ON CLINICAL CONDITION.   [name] M.D on 06/26/2017 at 3:51 PM  Between 7am to 6pm - Pager - [phone] After 6pm go to [email or web address] - password [email or web address]  [facility] [place]  Office  [phone]  CC: Primary care physician; Marguarite Arbour, MD  Note: This dictation was prepared with Dragon dictation along with smaller phrase technology. Any transcriptional errors that result from this process are unintentional.

## 2017-06-27 LAB — GLUCOSE, CAPILLARY
GLUCOSE-CAPILLARY: 96 mg/dL (ref 65–99)
GLUCOSE-CAPILLARY: 145 mg/dL — AB (ref 65–99)

## 2017-06-27 MED ORDER — IPRATROPIUM-ALBUTEROL 0.5-2.5 (3) MG/3ML IN SOLN: 3.0000 mL | Freq: Four times a day (QID) | RESPIRATORY_TRACT | 0 refills | Status: DC | PRN

## 2017-06-27 MED ORDER — AMOXICILLIN-POT CLAVULANATE 875-125 MG PO TABS
1.0000 | ORAL_TABLET | Freq: Two times a day (BID) | ORAL | 0 refills | Status: AC
Start: 2017-06-27 — End: 2017-07-04

## 2017-06-27 MED ORDER — AMOXICILLIN-POT CLAVULANATE 875-125 MG PO TABS
1.0000 | ORAL_TABLET | Freq: Two times a day (BID) | ORAL | Status: DC
  Administered 2017-06-27: 1 via ORAL
  Filled 2017-06-27: qty 1

## 2017-06-27 NOTE — Discharge Summary (Signed)
[facility]   PATIENT NAME: Patrick Chan    MR#:  [id number]  DATE OF BIRTH:  06/08/1922  DATE OF ADMISSION:  06/23/2017 ADMITTING PHYSICIAN: Marguarite Arbour, MD  DATE OF DISCHARGE: 06/27/2017  PRIMARY CARE PHYSICIAN: Marguarite Arbour, MD    ADMISSION DIAGNOSIS:  fall  DISCHARGE DIAGNOSIS:  Principal Problem:   SIRS (systemic inflammatory response syndrome) (HCC) Active Problems:   Type 2 diabetes mellitus (HCC)   CKD (chronic kidney disease), stage III (HCC)   UTI (urinary tract infection)   SECONDARY DIAGNOSIS:   Past Medical History:  Diagnosis Date  . BPH (benign prostatic hyperplasia)   . Depression   . Diabetes mellitus without complication (HCC)   . Hypertension   . Seizures [facility])     HOSPITAL COURSE:   83 y/o male with history of diabetes and essential hypertension who was brought to the emergency room after being found on the floor with weakness and confusion.  1.  Sepsis: Patient presentedwith fever and tachycardia Sepsis was due to UTI and has improved.  2.   Proteus mirabilis Urinary tract infection:  He was started on Rocephin. Urine cultures positive for Proteus which is pansensitive. He will be discharged on Augmentin.  3.  BPH: Continue finasteride  4.  Diabetes: Continue ADA diet and outpatient regimen. 5.  History of seizures: Continue Keppra  6.  History of glaucoma: Continue eyedrops   7.  PAF: Continue anticoagulation with Eliquis  8. Fever: Patient had a fever 2 days prior to discharge. The fever is likely due to atelectasis with possible pneumonia. He has had no fever over 24 hours. He has very minimal wheezing at the left lung base. He needs to continue with incentive spirometer as directed. His wheezing is likely from atelectasis.   DISCHARGE CONDITIONS AND DIET:   Stable for discharge and regular diabetic diet  CONSULTS OBTAINED:  Treatment Team:  Alwyn Pea,  MD  DRUG ALLERGIES:  No Known Allergies  DISCHARGE MEDICATIONS:   Current Discharge Medication List    START taking these medications   Details  amoxicillin-clavulanate (AUGMENTIN) 875-125 MG tablet Take 1 tablet by mouth every 12 (twelve) hours. Qty: 14 tablet, Refills: 0    ipratropium-albuterol (DUONEB) 0.5-2.5 (3) MG/3ML SOLN Take 3 mLs by nebulization every 6 (six) hours as needed. Qty: 360 mL, Refills: 0      CONTINUE these medications which have NOT CHANGED   Details  apixaban (ELIQUIS) 2.5 MG TABS tablet Take 2.5 mg by mouth 2 (two) times daily.    citalopram (CELEXA) 20 MG tablet Take 20 mg by mouth daily.    finasteride (PROSCAR) 5 MG tablet Take 5 mg by mouth daily.    glimepiride (AMARYL) 2 MG tablet Take 2 mg by mouth daily with breakfast.    levETIRAcetam (KEPPRA) 750 MG tablet Take 750 mg by mouth 2 (two) times daily.    LUMIGAN 0.01 % SOLN Place 1 drop into both eyes daily. Refills: 3    metoprolol (LOPRESSOR) 50 MG tablet Take 25 mg by mouth daily.    Multiple Vitamin (MULTIVITAMIN WITH MINERALS) TABS tablet Take 1 tablet by mouth daily.    traZODone (DESYREL) 50 MG tablet Take 50 mg by mouth at bedtime.     docusate sodium (COLACE) 100 MG capsule Take 100 mg by mouth 2 (two) times daily.      STOP taking these medications     cefUROXime (CEFTIN) 500 MG tablet  traMADol (ULTRAM) 50 MG tablet           Today   CHIEF COMPLAINT:  No issues overnight. Son is at bedside   VITAL SIGNS:  Blood pressure (!) 147/74, pulse 73, temperature (!) 97.5 F (36.4 C), temperature source Oral, resp. rate 20, height 6\' 2"  (1.88 m), weight 110.2 kg (243 lb), SpO2 100 %.   REVIEW OF SYSTEMS:  Review of Systems  Constitutional: Negative.  Negative for chills, fever and malaise/fatigue.  HENT: Negative.  Negative for ear discharge, ear pain, hearing loss, nosebleeds and sore throat.   Eyes: Negative.  Negative for blurred vision and pain.   Respiratory: Negative.  Negative for cough, hemoptysis, shortness of breath and wheezing.        Upper airway wheezing  Cardiovascular: Negative.  Negative for chest pain, palpitations and leg swelling.  Gastrointestinal: Negative.  Negative for abdominal pain, blood in stool, diarrhea, nausea and vomiting.  Genitourinary: Negative.  Negative for dysuria.  Musculoskeletal: Negative.  Negative for back pain.  Skin: Negative.   Neurological: Negative for dizziness, tremors, speech change, focal weakness, seizures and headaches.  Endo/Heme/Allergies: Negative.  Does not bruise/bleed easily.  Psychiatric/Behavioral: Positive for memory loss. Negative for depression, hallucinations and suicidal ideas.     PHYSICAL EXAMINATION:  GENERAL:  83 y.o.-year-old patient lying in the bed with no acute distress.  NECK:  Supple, no jugular venous distention. No thyroid enlargement, no tenderness.  LUNGS: LLL wheezing very mild CARDIOVASCULAR: S1, S2 normal. No murmurs, rubs, or gallops.  ABDOMEN: Soft, non-tender, non-distended. Bowel sounds present. No organomegaly or mass.  EXTREMITIES: No pedal edema, cyanosis, or clubbing.  PSYCHIATRIC: The patient is alert and oriented x 3.  SKIN: No obvious rash, lesion, or ulcer.   DATA REVIEW:   CBC  Recent Labs Lab 06/26/17 0154  WBC 8.6  HGB 13.3  HCT 40.0  PLT 157    Chemistries   Recent Labs Lab 06/24/17 0349  06/26/17 0154  NA 138  < > 140  K 3.9  < > 3.8  CL 107  < > 109  CO2 24  < > 24  GLUCOSE 143*  < > 152*  BUN 29*  < > 25*  CREATININE 1.39*  < > 1.31*  CALCIUM 8.1*  < > 8.6*  AST 42*  --   --   ALT 23  --   --   ALKPHOS 39  --   --   BILITOT 1.1  --   --   < > = values in this interval not displayed.  Cardiac Enzymes  Recent Labs Lab 06/23/17 1000  TROPONINI 0.06*    Microbiology Results  @MICRORSLT48 @  RADIOLOGY:  Dg Chest Port 1 View  Result Date: 06/26/2017 CLINICAL DATA:  83 year old male with sudden  onset shortness of breath. EXAM: PORTABLE CHEST 1 VIEW COMPARISON:  Chest radiograph dated 06/24/2017 FINDINGS: An area of streaky density at the left lung base may represent atelectasis/ scarring or infiltrate. Clinical correlation is recommended. There is no significant pleural effusion. No pneumothorax. Mild cardiomegaly. There is atherosclerotic calcification of the aortic arch. No acute osseous pathology. IMPRESSION: Left lung base atelectasis/ scarring versus infiltrate. Clinical correlation is recommended. Electronically Signed   By: Elgie Collard M.D.   On: 06/26/2017 02:43      Current Discharge Medication List    START taking these medications   Details  amoxicillin-clavulanate (AUGMENTIN) 875-125 MG tablet Take 1 tablet by mouth every 12 (twelve) hours. Qty: 14  tablet, Refills: 0    ipratropium-albuterol (DUONEB) 0.5-2.5 (3) MG/3ML SOLN Take 3 mLs by nebulization every 6 (six) hours as needed. Qty: 360 mL, Refills: 0      CONTINUE these medications which have NOT CHANGED   Details  apixaban (ELIQUIS) 2.5 MG TABS tablet Take 2.5 mg by mouth 2 (two) times daily.    citalopram (CELEXA) 20 MG tablet Take 20 mg by mouth daily.    finasteride (PROSCAR) 5 MG tablet Take 5 mg by mouth daily.    glimepiride (AMARYL) 2 MG tablet Take 2 mg by mouth daily with breakfast.    levETIRAcetam (KEPPRA) 750 MG tablet Take 750 mg by mouth 2 (two) times daily.    LUMIGAN 0.01 % SOLN Place 1 drop into both eyes daily. Refills: 3    metoprolol (LOPRESSOR) 50 MG tablet Take 25 mg by mouth daily.    Multiple Vitamin (MULTIVITAMIN WITH MINERALS) TABS tablet Take 1 tablet by mouth daily.    traZODone (DESYREL) 50 MG tablet Take 50 mg by mouth at bedtime.     docusate sodium (COLACE) 100 MG capsule Take 100 mg by mouth 2 (two) times daily.      STOP taking these medications     cefUROXime (CEFTIN) 500 MG tablet      traMADol (ULTRAM) 50 MG tablet            Management plans  discussed with the patient and son and he is in agreement. Stable for discharge snf  Patient should follow up with pcp  CODE STATUS:     Code Status Orders        Start     Ordered   06/23/17 1416  Full code  Continuous     06/23/17 1415    Code Status History    Date Active Date Inactive Code Status Order ID Comments User Context   10/10/2015 11:01 PM 10/12/2015  6:04 PM DNR [id number]  Oralia ManisWillis, David, MD Inpatient    Advance Directive Documentation     Most Recent Value  Type of Advance Directive  Living will  Pre-existing out of facility DNR order (yellow form or pink MOST form)  -  "MOST" Form in Place?  -      TOTAL TIME TAKING CARE OF THIS PATIENT: 38 minutes.    Note: This dictation was prepared with Dragon dictation along with smaller phrase technology. Any transcriptional errors that result from this process are unintentional.  Rosaleah Person M.D on 06/27/2017 at 8:39 AM  Between 7am to 6pm - Pager - [phone] After 6pm go to [email or web address] - Social research officer, governmentpassword [email or web address]  [facility]  Office  [phone]  CC: Primary care physician; Marguarite ArbourSparks, Jeffrey D, MD

## 2017-06-27 NOTE — Plan of Care (Signed)
Pt admitted with SIRS r/t UTI. Has been afebrile last 24 hours.  Had good appetite today.  [facility]. Son, Tammy SoursGreg has been at bedside.  [place] to [place].  Pt going to Rm 345.  IVs removed by Nurse Tech.  Came off tele.  When EMS got here, when they were obtaining VS, HR dropped to 20s-30s 5x using the dinemap.  Pt is AFib, but called Dr. Juliene PinaMody out of concern.  We got an O2 sensor monitor and he never dropped to 20s-30s, but did drop to 40s and then went back up to 70s-80s-90s.  Dr. Juliene PinaMody thought he would be ok to transport - didn't think dinemap was reliable to monitor Afib pt.  Asked EMS to please monitor HR on way over and they agreed.

## 2017-06-27 NOTE — Clinical Social Work Placement (Signed)
   CLINICAL SOCIAL WORK PLACEMENT  NOTE  Date:  06/27/2017  Patient Details  Name: Patrick Chan T Burgher MRN: [id number] Date of Birth: July 27, 1922  Clinical Social Work is seeking post-discharge placement for this patient at the Skilled  Nursing Facility level of care (*CSW will initial, date and re-position this form in  chart as items are completed):  Yes   Patient/family provided with [place] Clinical Social Work Department's list of facilities offering this level of care within the geographic area requested by the patient (or if unable, by the patient's family).  Yes   Patient/family informed of their freedom to choose among providers that offer the needed level of care, that participate in Medicare, Medicaid or managed care program needed by the patient, have an available bed and are willing to accept the patient.  Yes   Patient/family informed of [facility]'s ownership interest in [facility] and [facility], as well as of the fact that they are under no obligation to receive care at these facilities.  PASRR submitted to EDS on 06/22/17     PASRR number received on       Existing PASRR number confirmed on 06/22/17     FL2 transmitted to all facilities in geographic area requested by pt/family on 06/26/17     FL2 transmitted to all facilities within larger geographic area on       Patient informed that his/her managed care company has contracts with or will negotiate with certain facilities, including the following:        Yes   Patient/family informed of bed offers received.  [facility]     Physician recommends and patient chooses bed at      [facility].  [facility]     Patient family notified on 06/27/17 of transfer.  Name of family member notified:  Berniece PapGreg Mccauley     PHYSICIAN Please sign FL2     Additional Comment:     _______________________________________________ Darleene CleaverAnterhaus, Jamisha Hoeschen R, LCSWA 06/27/2017, 10:22 AM

## 2017-06-27 NOTE — Clinical Social Work Note (Signed)
[facility].  Patient and family agreeable to plans will transport via ems RN to call report [phone].  Windell MouldingEric Noreene Boreman, MSW, Theresia MajorsLCSWA [phone]

## 2017-06-28 ENCOUNTER — Encounter
Admission: RE | Admit: 2017-06-28 | Discharge: 2017-06-28 | Disposition: A | Discharge status: Home / Self Care | Payer: Medicare Other | Source: Ambulatory Visit | Attending: Internal Medicine | Admitting: Internal Medicine

## 2017-06-28 LAB — CULTURE, BLOOD (ROUTINE X 2)
Culture: NO GROWTH
Culture: NO GROWTH
SPECIAL REQUESTS: ADEQUATE

## 2017-07-05 ENCOUNTER — Encounter: Payer: Self-pay | Admitting: Gerontology

## 2017-07-05 ENCOUNTER — Non-Acute Institutional Stay (SKILLED_NURSING_FACILITY): Payer: Medicare Other | Admitting: Gerontology

## 2017-07-05 DIAGNOSIS — N3000 Acute cystitis without hematuria: Secondary | ICD-10-CM | POA: Diagnosis not present

## 2017-07-05 DIAGNOSIS — R531 Weakness: Secondary | ICD-10-CM

## 2017-07-05 NOTE — Progress Notes (Signed)
Location:   [facility] Room Number: 345P Place of Service:  SNF [phone] Provider:  Lorenso QuarryShannon Itha Kroeker, NP-C  Sparks, Duane LopeJeffrey D, MD  Patient Care Team: Marguarite ArbourSparks, Jeffrey D, MD as PCP - General (Internal Medicine)  Extended Emergency Contact Information Primary Emergency Contact: Renelda MomGreg,Mortell  [place] Home Phone: [phone] Relation: Son Secondary Emergency Contact: Sherrie GeorgeLunsford,Ott Tom  [place] Mobile Phone: [phone] Relation: Son  Code Status:  FULL Goals of care: Advanced Directive information Advanced Directives 07/05/2017  Does Patient Have a Medical Advance Directive? No  Type of Advance Directive -  Does patient want to make changes to medical advance directive? -  Copy of Healthcare Power of Attorney in Chart? -     Chief Complaint  Patient presents with  . Medical Management of Chronic Issues    Routine Visit    HPI:  Pt is a 83 y.o. male seen today for medical management of chronic diseases.  Patient was admitted to the facility for rehab following [facility] after sustaining a fall at home.  Patient was found to have sepsis related to UTI.  Upon discharge from the hospital, patient continued to have low-grade fever and minimal wheezing in left lung base, thought to be related to atelectasis.  Patient is to participate in PT and OT for strengthening and increased mobility.  Patient denies pain, chest pain or shortness of breath.  Patient reports appetite is good.  Voiding regularly and having regular BMs.  Vital signs stable.  No other complaints.   Past Medical History:  Diagnosis Date  . Arthritis   . BPH (benign prostatic hyperplasia)   . Chicken pox   . DDD (degenerative disc disease), lumbosacral    LS spine with HNP S1 by MRI 8/08  . Depression   . Diabetes mellitus type 2, uncomplicated (HCC)   . Diabetes mellitus without complication (HCC)   . History of colonic polyps   . Hypertension    . Nephrolithiasis   . Seizures (HCC)   . Sleep apnea    Past Surgical History:  Procedure Laterality Date  . CATARACT EXTRACTION, BILATERAL    . COLON SURGERY  01/2002   colonoscopy with polypectomy  . JOINT REPLACEMENT    . ORIF HIP FRACTURE Right 12/2011  . PROSTATE SURGERY    . TONSILLECTOMY      No Known Allergies  Allergies as of 07/05/2017   No Known Allergies     Medication List        Accurate as of 07/05/17  3:11 PM. Always use your most recent med list.          citalopram 20 MG tablet Commonly known as:  CELEXA Take 20 mg daily by mouth.   DERMACLOUD Crea Apply ointment topically once daily   docusate sodium 100 MG capsule Commonly known as:  COLACE Take 100 mg by mouth 2 (two) times daily.   ELIQUIS 2.5 MG Tabs tablet Generic drug:  apixaban Take 2.5 mg by mouth 2 (two) times daily.   finasteride 5 MG tablet Commonly known as:  PROSCAR Take 5 mg daily by mouth.   glimepiride 2 MG tablet Commonly known as:  AMARYL Take 2 mg daily with breakfast by mouth.   ipratropium-albuterol 0.5-2.5 (3) MG/3ML Soln Commonly known as:  DUONEB Take 3 mLs by nebulization every 6 (six) hours as needed.   levETIRAcetam 750 MG tablet Commonly known as:  KEPPRA Take 750 mg by mouth 2 (  two) times daily.   LUMIGAN 0.01 % Soln Generic drug:  bimatoprost Place 1 drop into both eyes daily.   Melatonin 3 MG Tabs Take 1 tablet at bedtime by mouth.   metoprolol tartrate 50 MG tablet Commonly known as:  LOPRESSOR Take 25 mg by mouth daily.   multivitamin with minerals Tabs tablet Take 1 tablet by mouth daily.   traZODone 50 MG tablet Commonly known as:  DESYREL Take 50 mg at bedtime by mouth.       Review of Systems  Constitutional: Negative for activity change, appetite change, chills, diaphoresis and fever.  HENT: Negative for congestion, mouth sores, nosebleeds, postnasal drip, sneezing, sore throat, trouble swallowing and voice change.     Respiratory: Negative for apnea, cough, choking, chest tightness, shortness of breath and wheezing.   Cardiovascular: Negative for chest pain, palpitations and leg swelling.  Gastrointestinal: Negative for abdominal distention, abdominal pain, constipation, diarrhea and nausea.  Genitourinary: Negative for difficulty urinating, dysuria, frequency and urgency.  Musculoskeletal: Positive for arthralgias (typical arthritis). Negative for back pain, gait problem and myalgias.  Skin: Negative for color change, pallor, rash and wound.  Neurological: Positive for weakness. Negative for dizziness, tremors, syncope, speech difficulty, numbness and headaches.  Psychiatric/Behavioral: Negative for agitation and behavioral problems.  All other systems reviewed and are negative.   Immunization History  Administered Date(s) Administered  . Influenza-Unspecified 05/16/2017   There are no preventive care reminders to display for this patient. No flowsheet data found. Functional Status Survey:    Vitals:   07/05/17 1453  BP: 127/67  Pulse: 90  Resp: 20  Temp: 97.8 F (36.6 C)  SpO2: 94%  Weight: 222 lb 4.8 oz (100.8 kg)  Height: 6\' 2"  (1.88 m)   Body mass index is 28.54 kg/m. Physical Exam  Constitutional: He is oriented to person, place, and time. Vital signs are normal. He appears well-developed and well-nourished. He is active and cooperative. He does not appear ill. No distress.  HENT:  Head: Normocephalic and atraumatic.  Right Ear: Decreased hearing is noted.  Left Ear: Decreased hearing is noted.  Mouth/Throat: Uvula is midline, oropharynx is clear and moist and mucous membranes are normal. Mucous membranes are not pale, not dry and not cyanotic.  Eyes: Conjunctivae, EOM and lids are normal. Pupils are equal, round, and reactive to light.  Neck: Trachea normal, normal range of motion and full passive range of motion without pain. Neck supple. No JVD present. No tracheal deviation, no  edema and no erythema present. No thyromegaly present.  Cardiovascular: Normal rate, regular rhythm, normal heart sounds, intact distal pulses and normal pulses. Exam reveals no gallop, no distant heart sounds and no friction rub.  No murmur heard. Pulses:      Dorsalis pedis pulses are 2+ on the right side, and 2+ on the left side.  No edema  Pulmonary/Chest: Effort normal and breath sounds normal. No accessory muscle usage. No respiratory distress. He has no decreased breath sounds. He has no wheezes. He has no rhonchi. He has no rales. He exhibits no tenderness.  Abdominal: Soft. Normal appearance and bowel sounds are normal. He exhibits no distension and no ascites. There is no tenderness.  Musculoskeletal: Normal range of motion. He exhibits no edema or tenderness.  Expected osteoarthritis, stiffness; Bilateral Calves soft, supple. Negative Homan's Sign. B- pedal pulses equal; generalized weakness and deconditioning  Neurological: He is alert and oriented to person, place, and time. He has normal strength. He exhibits abnormal muscle  tone. Coordination and gait abnormal.  Skin: Skin is warm, dry and intact. He is not diaphoretic. No cyanosis. No pallor. Nails show no clubbing.  Psychiatric: He has a normal mood and affect. His speech is normal and behavior is normal. Judgment and thought content normal. Cognition and memory are normal.  Nursing note and vitals reviewed.   Labs reviewed: Recent Labs    06/24/17 0349 06/25/17 0410 06/26/17 0154  NA 138 139 140  K 3.9 3.8 3.8  CL 107 109 109  CO2 24 24 24   GLUCOSE 143* 182* 152*  BUN 29* 27* 25*  CREATININE 1.39* 1.30* 1.31*  CALCIUM 8.1* 8.0* 8.6*   Recent Labs    06/23/17 1000 06/24/17 0349  AST 51* 42*  ALT 27 23  ALKPHOS 59 39  BILITOT 1.2 1.1  PROT 7.5 5.8*  ALBUMIN 3.9 2.9*   Recent Labs    06/24/17 0349 06/25/17 0410 06/26/17 0154  WBC 6.9 6.9 8.6  NEUTROABS  --   --  6.1  HGB 12.9* 12.7* 13.3  HCT 38.7*  36.8* 40.0  MCV 89.3 87.4 88.8  PLT 135* 136* 157   Lab Results  Component Value Date   TSH 3.92 07/17/2014   Lab Results  Component Value Date   HGBA1C 7.5 (H) 10/10/2015   Lab Results  Component Value Date   CHOL 117 07/17/2014   HDL 32 (L) 07/17/2014   LDLCALC 65 07/17/2014   TRIG 99 07/17/2014    Significant Diagnostic Results in last 30 days:  Dg Chest 1 View  Result Date: 06/24/2017 CLINICAL DATA:  Hypoxia. EXAM: CHEST 1 VIEW COMPARISON:  06/23/2017 FINDINGS: The cardiac silhouette remains mildly enlarged. Aortic atherosclerosis is noted. Lung volumes remain diminished with interstitial coarsening which is at least partially chronic. There is persistent asymmetric mild patchy opacity in the left lung base, not significantly changed. The minimal atelectasis is noted in the right lung base. No sizable pleural effusion or pneumothorax is identified. A calcified granuloma is noted in the left upper lobe. IMPRESSION: Persistent low lung volumes with basilar atelectasis. Electronically Signed   By: Sebastian Ache M.D.   On: 06/24/2017 08:15   Dg Chest 1 View  Result Date: 06/23/2017 CLINICAL DATA:  Shortness of breath. EXAM: CHEST 1 VIEW COMPARISON:  10/10/2015 FINDINGS: Shallow lung inflation. Tortuous thoracic aorta. There are no focal consolidations or pleural effusions. Mild left lower lobe atelectasis. No pulmonary edema. IMPRESSION: Shallow inflation.  Subsegmental atelectasis in the left lower lobe. Electronically Signed   By: Norva Pavlov M.D.   On: 06/23/2017 11:15   Ct Head Wo Contrast  Result Date: 06/23/2017 CLINICAL DATA:  Found down this morning. Right shoulder and back bruising. Confusion. EXAM: CT HEAD WITHOUT CONTRAST TECHNIQUE: Contiguous axial images were obtained from the base of the skull through the vertex without intravenous contrast. COMPARISON:  CT head 07/16/2014.  MRI brain 07/17/2014. FINDINGS: Brain: There is no evidence of acute intracranial  hemorrhage, mass lesion, brain edema or extra-axial fluid collection. The ventricles and subarachnoid spaces are appropriately sized for age. There is no CT evidence of acute cortical infarction. Stable mild chronic small vessel ischemic changes in the periventricular white matter. Vascular: Mild intracranial vascular calcifications. No hyperdense vessel identified. Skull: Negative for fracture or focal lesion. Sinuses/Orbits: The visualized paranasal sinuses and mastoid air cells are clear. No orbital abnormalities are seen. Other: None. IMPRESSION: Stable examination with mild chronic periventricular white matter disease. No acute intracranial findings. Electronically Signed   By:  Carey BullocksWilliam  Veazey M.D.   On: 06/23/2017 11:17   Dg Chest Port 1 View  Result Date: 06/26/2017 CLINICAL DATA:  83 year old male with sudden onset shortness of breath. EXAM: PORTABLE CHEST 1 VIEW COMPARISON:  Chest radiograph dated 06/24/2017 FINDINGS: An area of streaky density at the left lung base may represent atelectasis/ scarring or infiltrate. Clinical correlation is recommended. There is no significant pleural effusion. No pneumothorax. Mild cardiomegaly. There is atherosclerotic calcification of the aortic arch. No acute osseous pathology. IMPRESSION: Left lung base atelectasis/ scarring versus infiltrate. Clinical correlation is recommended. Electronically Signed   By: Elgie CollardArash  Radparvar M.D.   On: 06/26/2017 02:43    Assessment/Plan 1.  Acute cystitis without hematuria  Antibiotics complete  Resolved  2.  Generalized weakness  Continue PT/OT  Continue exercises as taught by PT/OT  Ambulate with walker and assistance   Family/ staff Communication:   Total Time:  Documentation:  Face to Face:  Family/Phone:   Labs/tests ordered:    Medication list reviewed and assessed for continued appropriateness. Monthly medication orders reviewed and signed.  Brynda RimShannon H. Laurajean Hosek, NP-C Geriatrics [facility]  [facility] [phone] [place] , [place] [id number] Cell Phone (Mon-Fri 8am-5pm):  [phone] On Call:  [phone] & follow prompts after 5pm & weekends Office Phone:  [phone] Office Fax:  [phone]

## 2017-07-12 ENCOUNTER — Non-Acute Institutional Stay (SKILLED_NURSING_FACILITY): Payer: Medicare Other | Admitting: Gerontology

## 2017-07-12 ENCOUNTER — Encounter: Payer: Self-pay | Admitting: Gerontology

## 2017-07-12 DIAGNOSIS — E46 Unspecified protein-calorie malnutrition: Secondary | ICD-10-CM

## 2017-07-12 DIAGNOSIS — R531 Weakness: Secondary | ICD-10-CM | POA: Diagnosis not present

## 2017-07-12 NOTE — Progress Notes (Signed)
Location:   [facility] Room Number: 345P Place of Service:  SNF [phone]([phone] Provider:  Lorenso QuarryShannon Iann Rodier, NP-C  Sparks, Duane LopeJeffrey D, MD  Patient Care Team: Marguarite ArbourSparks, Jeffrey D, MD as PCP - General (Internal Medicine)  Extended Emergency Contact Information Primary Emergency Contact: Renelda MomGreg,[place]  [place] Home Phone: [phone] Relation: Son Secondary Emergency Contact: Sherrie GeorgeLunsford,Cleotis Tom  [place] Mobile Phone: [phone] Relation: Son  Code Status:  FULL Goals of care: Advanced Directive information Advanced Directives 07/12/2017  Does Patient Have a Medical Advance Directive? No  Type of Advance Directive -  Does patient want to make changes to medical advance directive? No - Patient declined  Copy of Healthcare Power of Attorney in Chart? -     Chief Complaint  Patient presents with  . Medical Management of Chronic Issues    Routine Visit    HPI:  Pt is a 83 y.o. male seen today for medical management of chronic diseases.  Patient was admitted to the facility for rehab following [facility] status post fall at home.  Patient was found to have sepsis related to UTI.  UTI has now resolved.  Patient is participating in PT/OT.  Patient reports his pain is well controlled.  Was found on lab work this past week, patient has significant protein-calorie malnutrition.  Started on supplements.  Patient reports he is tolerating this well.  Patient denies pain, chest pain, shortness of breath.  Patient has been keeping legs elevated while at rest.  Vital signs stable.  No other complaints.   Past Medical History:  Diagnosis Date  . Arthritis   . BPH (benign prostatic hyperplasia)   . Chicken pox   . DDD (degenerative disc disease), lumbosacral    LS spine with HNP S1 by MRI 8/08  . Depression   . Diabetes mellitus type 2, uncomplicated (HCC)   . Diabetes mellitus without complication (HCC)   . History of colonic polyps     . Hypertension   . Nephrolithiasis   . Seizures (HCC)   . Sleep apnea    Past Surgical History:  Procedure Laterality Date  . CATARACT EXTRACTION, BILATERAL    . COLON SURGERY  01/2002   colonoscopy with polypectomy  . JOINT REPLACEMENT    . ORIF HIP FRACTURE Right 12/2011  . PROSTATE SURGERY    . TONSILLECTOMY      No Known Allergies  Allergies as of 07/12/2017   No Known Allergies     Medication List        Accurate as of 07/12/17 12:06 PM. Always use your most recent med list.          citalopram 20 MG tablet Commonly known as:  CELEXA Take 20 mg daily by mouth.   DERMACLOUD Crea Apply ointment topically once daily   docusate sodium 100 MG capsule Commonly known as:  COLACE Take 100 mg by mouth 2 (two) times daily.   ELIQUIS 2.5 MG Tabs tablet Generic drug:  apixaban Take 2.5 mg by mouth 2 (two) times daily.   feeding supplement (PRO-STAT SUGAR FREE 64) Liqd Take 30 mLs 2 (two) times daily between meals by mouth.   finasteride 5 MG tablet Commonly known as:  PROSCAR Take 5 mg daily by mouth.   glimepiride 2 MG tablet Commonly known as:  AMARYL Take 2 mg daily with breakfast by mouth.   ipratropium-albuterol 0.5-2.5 (3) MG/3ML Soln Commonly known as:  DUONEB Take 3 mLs by  nebulization every 6 (six) hours as needed.   levETIRAcetam 750 MG tablet Commonly known as:  KEPPRA Take 750 mg by mouth 2 (two) times daily.   LUMIGAN 0.01 % Soln Generic drug:  bimatoprost Place 1 drop into both eyes daily.   Melatonin 3 MG Tabs Take 1 tablet at bedtime by mouth.   metoprolol tartrate 25 MG tablet Commonly known as:  LOPRESSOR Take 25 mg daily by mouth.   multivitamin with minerals Tabs tablet Take 1 tablet by mouth daily.   traZODone 50 MG tablet Commonly known as:  DESYREL Take 50 mg at bedtime by mouth.       Review of Systems  Constitutional: Negative for activity change, appetite change, chills, diaphoresis and fever.  HENT: Negative  for congestion, mouth sores, nosebleeds, postnasal drip, sneezing, sore throat, trouble swallowing and voice change.   Respiratory: Negative for apnea, cough, choking, chest tightness, shortness of breath and wheezing.   Cardiovascular: Negative for chest pain, palpitations and leg swelling.  Gastrointestinal: Negative for abdominal distention, abdominal pain, constipation, diarrhea and nausea.  Genitourinary: Negative for difficulty urinating, dysuria, frequency and urgency.  Musculoskeletal: Negative for back pain, gait problem and myalgias. Arthralgias: typical arthritis.  Skin: Negative for color change, pallor, rash and wound.  Neurological: Positive for weakness. Negative for dizziness, tremors, syncope, speech difficulty, numbness and headaches.  Psychiatric/Behavioral: Negative for agitation and behavioral problems.  All other systems reviewed and are negative.   Immunization History  Administered Date(s) Administered  . Influenza-Unspecified 05/16/2017   There are no preventive care reminders to display for this patient. No flowsheet data found. Functional Status Survey:    Vitals:   07/12/17 1142  BP: 124/65  Pulse: 74  Resp: 20  Temp: 97.7 F (36.5 C)  TempSrc: Oral  SpO2: 94%  Weight: 222 lb 1.6 oz (100.7 kg)  Height: 6\' 2"  (1.88 m)   Body mass index is 28.52 kg/m. Physical Exam  Constitutional: He is oriented to person, place, and time. Vital signs are normal. He appears well-developed and well-nourished. He is active and cooperative. He does not appear ill. No distress.  HENT:  Head: Normocephalic and atraumatic.  Mouth/Throat: Uvula is midline, oropharynx is clear and moist and mucous membranes are normal. Mucous membranes are not pale, not dry and not cyanotic.  Eyes: Conjunctivae, EOM and lids are normal. Pupils are equal, round, and reactive to light.  Neck: Trachea normal, normal range of motion and full passive range of motion without pain. Neck supple. No  JVD present. No tracheal deviation, no edema and no erythema present. No thyromegaly present.  Cardiovascular: Normal rate, regular rhythm, normal heart sounds, intact distal pulses and normal pulses. Exam reveals no gallop, no distant heart sounds and no friction rub.  No murmur heard. Pulses:      Dorsalis pedis pulses are 2+ on the right side, and 2+ on the left side.  1+ BLE edema  Pulmonary/Chest: Effort normal and breath sounds normal. No accessory muscle usage. No respiratory distress. He has no decreased breath sounds. He has no wheezes. He has no rhonchi. He has no rales. He exhibits no tenderness.  Abdominal: Soft. Normal appearance and bowel sounds are normal. He exhibits no distension and no ascites. There is no tenderness.  Musculoskeletal: Normal range of motion. He exhibits no edema or tenderness.  Expected osteoarthritis, stiffness; Bilateral Calves soft, supple. Negative Homan's Sign. B- pedal pulses equal  Neurological: He is alert and oriented to person, place, and time. He  has normal strength.  Skin: Skin is warm, dry and intact. He is not diaphoretic. No cyanosis. No pallor. Nails show no clubbing.  Psychiatric: He has a normal mood and affect. His speech is normal and behavior is normal. Judgment and thought content normal. Cognition and memory are normal.  Nursing note and vitals reviewed.   Labs reviewed: Recent Labs    06/24/17 0349 06/25/17 0410 06/26/17 0154  NA 138 139 140  K 3.9 3.8 3.8  CL 107 109 109  CO2 24 24 24   GLUCOSE 143* 182* 152*  BUN 29* 27* 25*  CREATININE 1.39* 1.30* 1.31*  CALCIUM 8.1* 8.0* 8.6*   Recent Labs    06/23/17 1000 06/24/17 0349  AST 51* 42*  ALT 27 23  ALKPHOS 59 39  BILITOT 1.2 1.1  PROT 7.5 5.8*  ALBUMIN 3.9 2.9*   Recent Labs    06/24/17 0349 06/25/17 0410 06/26/17 0154  WBC 6.9 6.9 8.6  NEUTROABS  --   --  6.1  HGB 12.9* 12.7* 13.3  HCT 38.7* 36.8* 40.0  MCV 89.3 87.4 88.8  PLT 135* 136* 157   Lab Results   Component Value Date   TSH 3.92 07/17/2014   Lab Results  Component Value Date   HGBA1C 7.5 (H) 10/10/2015   Lab Results  Component Value Date   CHOL 117 07/17/2014   HDL 32 (L) 07/17/2014   LDLCALC 65 07/17/2014   TRIG 99 07/17/2014    Significant Diagnostic Results in last 30 days:  Dg Chest 1 View  Result Date: 06/24/2017 CLINICAL DATA:  Hypoxia. EXAM: CHEST 1 VIEW COMPARISON:  06/23/2017 FINDINGS: The cardiac silhouette remains mildly enlarged. Aortic atherosclerosis is noted. Lung volumes remain diminished with interstitial coarsening which is at least partially chronic. There is persistent asymmetric mild patchy opacity in the left lung base, not significantly changed. The minimal atelectasis is noted in the right lung base. No sizable pleural effusion or pneumothorax is identified. A calcified granuloma is noted in the left upper lobe. IMPRESSION: Persistent low lung volumes with basilar atelectasis. Electronically Signed   By: Sebastian Ache M.D.   On: 06/24/2017 08:15   Dg Chest 1 View  Result Date: 06/23/2017 CLINICAL DATA:  Shortness of breath. EXAM: CHEST 1 VIEW COMPARISON:  10/10/2015 FINDINGS: Shallow lung inflation. Tortuous thoracic aorta. There are no focal consolidations or pleural effusions. Mild left lower lobe atelectasis. No pulmonary edema. IMPRESSION: Shallow inflation.  Subsegmental atelectasis in the left lower lobe. Electronically Signed   By: Norva Pavlov M.D.   On: 06/23/2017 11:15   Ct Head Wo Contrast  Result Date: 06/23/2017 CLINICAL DATA:  Found down this morning. Right shoulder and back bruising. Confusion. EXAM: CT HEAD WITHOUT CONTRAST TECHNIQUE: Contiguous axial images were obtained from the base of the skull through the vertex without intravenous contrast. COMPARISON:  CT head 07/16/2014.  MRI brain 07/17/2014. FINDINGS: Brain: There is no evidence of acute intracranial hemorrhage, mass lesion, brain edema or extra-axial fluid collection. The  ventricles and subarachnoid spaces are appropriately sized for age. There is no CT evidence of acute cortical infarction. Stable mild chronic small vessel ischemic changes in the periventricular white matter. Vascular: Mild intracranial vascular calcifications. No hyperdense vessel identified. Skull: Negative for fracture or focal lesion. Sinuses/Orbits: The visualized paranasal sinuses and mastoid air cells are clear. No orbital abnormalities are seen. Other: None. IMPRESSION: Stable examination with mild chronic periventricular white matter disease. No acute intracranial findings. Electronically Signed   By: Chrissie Noa  Purcell MoutonVeazey M.D.   On: 06/23/2017 11:17   Dg Chest Port 1 View  Result Date: 06/26/2017 CLINICAL DATA:  83 year old male with sudden onset shortness of breath. EXAM: PORTABLE CHEST 1 VIEW COMPARISON:  Chest radiograph dated 06/24/2017 FINDINGS: An area of streaky density at the left lung base may represent atelectasis/ scarring or infiltrate. Clinical correlation is recommended. There is no significant pleural effusion. No pneumothorax. Mild cardiomegaly. There is atherosclerotic calcification of the aortic arch. No acute osseous pathology. IMPRESSION: Left lung base atelectasis/ scarring versus infiltrate. Clinical correlation is recommended. Electronically Signed   By: Elgie CollardArash  Radparvar M.D.   On: 06/26/2017 02:43    Assessment/Plan 1.  Protein calorie malnutrition, unspecified severity  Pro-stat 30 mL p.o. twice daily between meals  Ensure Enlive 1 bottle p.o. twice daily between meals  Continue daily multivitamin  2.  Generalized weakness  Continue working with PT/OT  Continue exercises as taught by PT/OT  Ambulate with assistance and walker  Out of bed to chair at least once daily  Family/ staff Communication:   Total Time:  Documentation:  Face to Face:  Family/Phone:   Labs/tests ordered:    Medication list reviewed and assessed for continued appropriateness.  Monthly medication orders reviewed and signed.  Brynda RimShannon H. Theordore Cisnero, NP-C Geriatrics [facility] [phone] [address] [place], [place] [address] Cell Phone (Mon-Fri 8am-5pm):  [phone] On Call:  [phone] & follow prompts after 5pm & weekends Office Phone:  [phone] Office Fax:  [phone]

## 2017-07-18 ENCOUNTER — Non-Acute Institutional Stay (SKILLED_NURSING_FACILITY): Payer: Medicare Other | Admitting: Gerontology

## 2017-07-18 ENCOUNTER — Encounter: Payer: Self-pay | Admitting: Gerontology

## 2017-07-18 DIAGNOSIS — E46 Unspecified protein-calorie malnutrition: Secondary | ICD-10-CM | POA: Diagnosis not present

## 2017-07-18 DIAGNOSIS — R531 Weakness: Secondary | ICD-10-CM | POA: Diagnosis not present

## 2017-07-18 NOTE — Progress Notes (Signed)
[facility]:   [facility] Room Number: 345P Place of Service:  SNF [phone] Provider:  Lorenso QuarryShannon Willia Genrich, NP-C  Sparks, Duane LopeJeffrey D, MD  Patient Care Team: Marguarite ArbourSparks, Jeffrey D, MD as PCP - General (Internal Medicine)  Extended Emergency Contact Information Primary Emergency Contact: Renelda MomGreg,Scalici  [place] Home Phone: [phone] Relation: Son Secondary Emergency Contact: Sherrie GeorgeLunsford,Jerime Tom  [place] Mobile Phone: [phone] Relation: Son  Code Status: FULL Goals of care: Advanced Directive information Advanced Directives 07/18/2017  Does Patient Have a Medical Advance Directive? No  Type of Advance Directive -  Does patient want to make changes to medical advance directive? No - Patient declined  Copy of Healthcare Power of Attorney in Chart? -     Chief Complaint  Patient presents with  . Medical Management of Chronic Issues    Routine Visit    HPI:  Pt is a 83 y.o. male seen today for medical management of chronic diseases.  Patient was admitted to the facility after [facility] at [facility].  Patient has been participating in PT/OT for generalized weakness and deconditioning.  Patient reports he does not have any pain.  He denies chest pain and shortness of breath.  Patient keeps legs elevated when at rest.  Patient reports his appetite is good.  He is voiding without difficulty and having regular BMs.  Vital signs stable.  No other complaints.   Past Medical History:  Diagnosis Date  . Arthritis   . BPH (benign prostatic hyperplasia)   . Chicken pox   . DDD (degenerative disc disease), lumbosacral    LS spine with HNP S1 by MRI 8/08  . Depression   . Diabetes mellitus type 2, uncomplicated (HCC)   . Diabetes mellitus without complication (HCC)   . History of colonic polyps   . Hypertension   . Nephrolithiasis   . Seizures (HCC)   . Sleep apnea    Past Surgical History:  Procedure  Laterality Date  . CATARACT EXTRACTION, BILATERAL    . COLON SURGERY  01/2002   colonoscopy with polypectomy  . JOINT REPLACEMENT    . ORIF HIP FRACTURE Right 12/2011  . PROSTATE SURGERY    . TONSILLECTOMY      No Known Allergies  Allergies as of 07/18/2017   No Known Allergies     Medication List        Accurate as of 07/18/17  3:04 PM. Always use your most recent med list.          citalopram 20 MG tablet Commonly known as:  CELEXA Take 20 mg daily by mouth.   DERMACLOUD Crea Apply ointment topically once daily   docusate sodium 100 MG capsule Commonly known as:  COLACE Take 100 mg by mouth 2 (two) times daily.   ELIQUIS 2.5 MG Tabs tablet Generic drug:  apixaban Take 2.5 mg by mouth 2 (two) times daily.   feeding supplement (PRO-STAT SUGAR FREE 64) Liqd Take 30 mLs 2 (two) times daily between meals by mouth.   finasteride 5 MG tablet Commonly known as:  PROSCAR Take 5 mg daily by mouth.   glimepiride 2 MG tablet Commonly known as:  AMARYL Take 2 mg daily with breakfast by mouth.   levETIRAcetam 750 MG tablet Commonly known as:  KEPPRA Take 750 mg by mouth 2 (two) times daily.   LUMIGAN 0.01 % Soln Generic drug:  bimatoprost Place 1 drop into both eyes daily.  Melatonin 3 MG Tabs Take 1 tablet at bedtime by mouth.   metoprolol tartrate 25 MG tablet Commonly known as:  LOPRESSOR Take 25 mg daily by mouth.   multivitamin with minerals Tabs tablet Take 1 tablet by mouth daily.   traZODone 50 MG tablet Commonly known as:  DESYREL Take 50 mg at bedtime by mouth.       Review of Systems  Constitutional: Negative for activity change, appetite change, chills, diaphoresis and fever.  HENT: Negative for congestion, mouth sores, nosebleeds, postnasal drip, sneezing, sore throat, trouble swallowing and voice change.   Respiratory: Negative for apnea, cough, choking, chest tightness, shortness of breath and wheezing.   Cardiovascular: Negative for  chest pain, palpitations and leg swelling.  Gastrointestinal: Negative for abdominal distention, abdominal pain, constipation, diarrhea and nausea.  Genitourinary: Negative for difficulty urinating, dysuria, frequency and urgency.  Musculoskeletal: Negative for back pain, gait problem and myalgias. Arthralgias: typical arthritis.  Skin: Negative for color change, pallor, rash and wound.  Neurological: Positive for weakness. Negative for dizziness, tremors, syncope, speech difficulty, numbness and headaches.  Psychiatric/Behavioral: Negative for agitation and behavioral problems.  All other systems reviewed and are negative.   Immunization History  Administered Date(s) Administered  . Influenza-Unspecified 05/16/2017   There are no preventive care reminders to display for this patient. No flowsheet data found. Functional Status Survey:    Vitals:   07/18/17 1457  BP: (!) 141/70  Pulse: 67  Resp: 20  Temp: (!) 97.5 F (36.4 C)  TempSrc: Oral  SpO2: 94%  Weight: 222 lb 1.6 oz (100.7 kg)  Height: 6\' 2"  (1.88 m)   Body mass index is 28.52 kg/m. Physical Exam  Constitutional: He is oriented to person, place, and time. Vital signs are normal. He appears well-developed and well-nourished. He is active and cooperative. He does not appear ill. No distress.  HENT:  Head: Normocephalic and atraumatic.  Mouth/Throat: Uvula is midline, oropharynx is clear and moist and mucous membranes are normal. Mucous membranes are not pale, not dry and not cyanotic.  Eyes: Conjunctivae, EOM and lids are normal. Pupils are equal, round, and reactive to light.  Neck: Trachea normal, normal range of motion and full passive range of motion without pain. Neck supple. No JVD present. No tracheal deviation, no edema and no erythema present. No thyromegaly present.  Cardiovascular: Normal rate, regular rhythm, normal heart sounds, intact distal pulses and normal pulses. Exam reveals no gallop, no distant heart  sounds and no friction rub.  No murmur heard. Pulses:      Dorsalis pedis pulses are 2+ on the right side, and 2+ on the left side.  No edema  Pulmonary/Chest: Effort normal and breath sounds normal. No accessory muscle usage. No respiratory distress. He has no decreased breath sounds. He has no wheezes. He has no rhonchi. He has no rales. He exhibits no tenderness.  Abdominal: Soft. Normal appearance and bowel sounds are normal. He exhibits no distension and no ascites. There is no tenderness.  Musculoskeletal: Normal range of motion. He exhibits no edema or tenderness.  Expected osteoarthritis, stiffness; Bilateral Calves soft, supple. Negative Homan's Sign. B- pedal pulses equal  Neurological: He is alert and oriented to person, place, and time. He has normal strength. Coordination and gait abnormal.  Skin: Skin is warm, dry and intact. He is not diaphoretic. No cyanosis. No pallor. Nails show no clubbing.  Psychiatric: He has a normal mood and affect. His speech is normal and behavior is normal. Judgment  and thought content normal. Cognition and memory are impaired.  Nursing note and vitals reviewed.   Labs reviewed: Recent Labs    06/24/17 0349 06/25/17 0410 06/26/17 0154  NA 138 139 140  K 3.9 3.8 3.8  CL 107 109 109  CO2 24 24 24   GLUCOSE 143* 182* 152*  BUN 29* 27* 25*  CREATININE 1.39* 1.30* 1.31*  CALCIUM 8.1* 8.0* 8.6*   Recent Labs    06/23/17 1000 06/24/17 0349  AST 51* 42*  ALT 27 23  ALKPHOS 59 39  BILITOT 1.2 1.1  PROT 7.5 5.8*  ALBUMIN 3.9 2.9*   Recent Labs    06/24/17 0349 06/25/17 0410 06/26/17 0154  WBC 6.9 6.9 8.6  NEUTROABS  --   --  6.1  HGB 12.9* 12.7* 13.3  HCT 38.7* 36.8* 40.0  MCV 89.3 87.4 88.8  PLT 135* 136* 157   Lab Results  Component Value Date   TSH 3.92 07/17/2014   Lab Results  Component Value Date   HGBA1C 7.5 (H) 10/10/2015   Lab Results  Component Value Date   CHOL 117 07/17/2014   HDL 32 (L) 07/17/2014   LDLCALC  65 07/17/2014   TRIG 99 07/17/2014    Significant Diagnostic Results in last 30 days:  Dg Chest 1 View  Result Date: 06/24/2017 CLINICAL DATA:  Hypoxia. EXAM: CHEST 1 VIEW COMPARISON:  06/23/2017 FINDINGS: The cardiac silhouette remains mildly enlarged. Aortic atherosclerosis is noted. Lung volumes remain diminished with interstitial coarsening which is at least partially chronic. There is persistent asymmetric mild patchy opacity in the left lung base, not significantly changed. The minimal atelectasis is noted in the right lung base. No sizable pleural effusion or pneumothorax is identified. A calcified granuloma is noted in the left upper lobe. IMPRESSION: Persistent low lung volumes with basilar atelectasis. Electronically Signed   By: Sebastian Ache M.D.   On: 06/24/2017 08:15   Dg Chest 1 View  Result Date: 06/23/2017 CLINICAL DATA:  Shortness of breath. EXAM: CHEST 1 VIEW COMPARISON:  10/10/2015 FINDINGS: Shallow lung inflation. Tortuous thoracic aorta. There are no focal consolidations or pleural effusions. Mild left lower lobe atelectasis. No pulmonary edema. IMPRESSION: Shallow inflation.  Subsegmental atelectasis in the left lower lobe. Electronically Signed   By: Norva Pavlov M.D.   On: 06/23/2017 11:15   Ct Head Wo Contrast  Result Date: 06/23/2017 CLINICAL DATA:  Found down this morning. Right shoulder and back bruising. Confusion. EXAM: CT HEAD WITHOUT CONTRAST TECHNIQUE: Contiguous axial images were obtained from the base of the skull through the vertex without intravenous contrast. COMPARISON:  CT head 07/16/2014.  MRI brain 07/17/2014. FINDINGS: Brain: There is no evidence of acute intracranial hemorrhage, mass lesion, brain edema or extra-axial fluid collection. The ventricles and subarachnoid spaces are appropriately sized for age. There is no CT evidence of acute cortical infarction. Stable mild chronic small vessel ischemic changes in the periventricular white matter.  Vascular: Mild intracranial vascular calcifications. No hyperdense vessel identified. Skull: Negative for fracture or focal lesion. Sinuses/Orbits: The visualized paranasal sinuses and mastoid air cells are clear. No orbital abnormalities are seen. Other: None. IMPRESSION: Stable examination with mild chronic periventricular white matter disease. No acute intracranial findings. Electronically Signed   By: Carey Bullocks M.D.   On: 06/23/2017 11:17   Dg Chest Port 1 View  Result Date: 06/26/2017 CLINICAL DATA:  83 year old male with sudden onset shortness of breath. EXAM: PORTABLE CHEST 1 VIEW COMPARISON:  Chest radiograph dated 06/24/2017 FINDINGS:  An area of streaky density at the left lung base may represent atelectasis/ scarring or infiltrate. Clinical correlation is recommended. There is no significant pleural effusion. No pneumothorax. Mild cardiomegaly. There is atherosclerotic calcification of the aortic arch. No acute osseous pathology. IMPRESSION: Left lung base atelectasis/ scarring versus infiltrate. Clinical correlation is recommended. Electronically Signed   By: Elgie CollardArash  Radparvar M.D.   On: 06/26/2017 02:43    Assessment/Plan 1.  Generalized weakness  Continue PT/OT  Continue exercises as taught by PT/OT  Ambulate with walker and assistance  Out of bed to chair at least date at least daily  2.  Protein calorie malnutrition, unspecified severity   Continue pro-stat 30 mL p.o. twice daily  Continue Ensure Enlive 1 bottle p.o. twice daily  Continue multivitamin 1 tablet p.o. daily   Family/ staff Communication:   Total Time:  Documentation:  Face to Face:  Family/Phone:   Labs/tests ordered:   Medication list reviewed and assessed for continued appropriateness. Monthly medication orders reviewed and signed.  Brynda RimShannon H. Sebastiana Wuest, NP-C Geriatrics [facility] [phone] [address] [place], [place] [id number] Cell Phone (Mon-Fri 8am-5pm):   [phone] On Call:  [phone] & follow prompts after 5pm & weekends Office Phone:  [phone] Office Fax:  [phone]

## 2017-07-28 ENCOUNTER — Encounter
Admission: RE | Admit: 2017-07-28 | Discharge: 2017-07-28 | Disposition: A | Discharge status: Home / Self Care | Payer: Medicare Other | Source: Ambulatory Visit | Attending: Internal Medicine | Admitting: Internal Medicine

## 2017-08-05 ENCOUNTER — Other Ambulatory Visit
Admission: RE | Admit: 2017-08-05 | Discharge: 2017-08-05 | Disposition: A | Discharge status: Home / Self Care | Payer: Medicare Other | Source: Skilled Nursing Facility | Attending: Gerontology | Admitting: Gerontology

## 2017-08-05 DIAGNOSIS — R319 Hematuria, unspecified: Secondary | ICD-10-CM | POA: Insufficient documentation

## 2017-08-05 DIAGNOSIS — R5381 Other malaise: Secondary | ICD-10-CM | POA: Diagnosis present

## 2017-08-05 DIAGNOSIS — R509 Fever, unspecified: Secondary | ICD-10-CM | POA: Diagnosis present

## 2017-08-05 LAB — URINALYSIS, COMPLETE (UACMP) WITH MICROSCOPIC
BILIRUBIN URINE: NEGATIVE
GLUCOSE, UA: NEGATIVE mg/dL
KETONES UR: NEGATIVE mg/dL
NITRITE: POSITIVE — AB
PH: 5 (ref 5.0–8.0)
Protein, ur: 30 mg/dL — AB
SPECIFIC GRAVITY, URINE: 1.012 (ref 1.005–1.030)
SQUAMOUS EPITHELIAL / LPF: NONE SEEN

## 2017-08-08 LAB — URINE CULTURE

## 2017-08-16 ENCOUNTER — Non-Acute Institutional Stay (SKILLED_NURSING_FACILITY): Payer: Medicare Other | Admitting: Gerontology

## 2017-08-16 ENCOUNTER — Encounter: Payer: Self-pay | Admitting: Gerontology

## 2017-08-16 DIAGNOSIS — N183 Chronic kidney disease, stage 3 unspecified: Secondary | ICD-10-CM

## 2017-08-16 DIAGNOSIS — F329 Major depressive disorder, single episode, unspecified: Secondary | ICD-10-CM | POA: Diagnosis not present

## 2017-08-16 DIAGNOSIS — G4700 Insomnia, unspecified: Secondary | ICD-10-CM

## 2017-08-16 DIAGNOSIS — R569 Unspecified convulsions: Secondary | ICD-10-CM

## 2017-08-16 DIAGNOSIS — I480 Paroxysmal atrial fibrillation: Secondary | ICD-10-CM

## 2017-08-16 DIAGNOSIS — Z9181 History of falling: Secondary | ICD-10-CM | POA: Diagnosis not present

## 2017-08-16 DIAGNOSIS — K5900 Constipation, unspecified: Secondary | ICD-10-CM

## 2017-08-16 DIAGNOSIS — I10 Essential (primary) hypertension: Secondary | ICD-10-CM | POA: Diagnosis not present

## 2017-08-16 DIAGNOSIS — G4733 Obstructive sleep apnea (adult) (pediatric): Secondary | ICD-10-CM | POA: Diagnosis not present

## 2017-08-16 DIAGNOSIS — N400 Enlarged prostate without lower urinary tract symptoms: Secondary | ICD-10-CM

## 2017-08-16 DIAGNOSIS — E46 Unspecified protein-calorie malnutrition: Secondary | ICD-10-CM

## 2017-08-16 DIAGNOSIS — M6281 Muscle weakness (generalized): Secondary | ICD-10-CM

## 2017-08-16 DIAGNOSIS — E1122 Type 2 diabetes mellitus with diabetic chronic kidney disease: Secondary | ICD-10-CM | POA: Diagnosis not present

## 2017-08-16 DIAGNOSIS — H409 Unspecified glaucoma: Secondary | ICD-10-CM

## 2017-08-16 DIAGNOSIS — Z9989 Dependence on other enabling machines and devices: Secondary | ICD-10-CM

## 2017-08-27 DIAGNOSIS — G4700 Insomnia, unspecified: Secondary | ICD-10-CM | POA: Insufficient documentation

## 2017-08-27 DIAGNOSIS — Z9181 History of falling: Secondary | ICD-10-CM | POA: Insufficient documentation

## 2017-08-27 DIAGNOSIS — R569 Unspecified convulsions: Secondary | ICD-10-CM | POA: Insufficient documentation

## 2017-08-27 DIAGNOSIS — E46 Unspecified protein-calorie malnutrition: Secondary | ICD-10-CM | POA: Insufficient documentation

## 2017-08-27 DIAGNOSIS — H409 Unspecified glaucoma: Secondary | ICD-10-CM | POA: Insufficient documentation

## 2017-08-27 DIAGNOSIS — M6281 Muscle weakness (generalized): Secondary | ICD-10-CM | POA: Insufficient documentation

## 2017-08-27 DIAGNOSIS — I480 Paroxysmal atrial fibrillation: Secondary | ICD-10-CM | POA: Insufficient documentation

## 2017-08-27 DIAGNOSIS — K5900 Constipation, unspecified: Secondary | ICD-10-CM | POA: Insufficient documentation

## 2017-08-27 NOTE — Progress Notes (Signed)
Location:    [facility]ursing Home Room Number: 767M Place of Service:  SNF (31) Provider:  Toni Arthurs, NP-C  Hughes Better, MD  Patient Care Team: Frazier Richards, MD as PCP - General (Internal Medicine)  Extended Emergency Contact Information Primary Emergency Contact: Pia Mau of [place] Phone: [phone] Relation: Son Secondary Emergency Contact: Sheral Apley [place] Mobile Phone: [phone] Relation: Son  Code Status: Full Goals of care: Advanced Directive information Advanced Directives 08/16/2017  Does Patient Have a Medical Advance Directive? No  Type of Advance Directive -  Does patient want to make changes to medical advance directive? No - Patient declined  Copy of [place] in Chart? -     Chief Complaint  Patient presents with  . Medical Management of Chronic Issues    Routine Visit    HPI:  Pt is a 83 y.o. male seen today for medical management of chronic diseases.  Patient was admitted to the facility for rehab following admit to [facility] status post fall.  Patient was getting rehab for generalized weakness and deconditioning.  Patient has now transitioned to long-term care resident.  Patient will remain a resident on the [facility] unit.  Patient reports he is feeling somewhat stronger.  He denies pain.  Denies chest pain or shortness of breath.  Patient reports he is feeling well, voiding well and having regular BMs.  He verbalizes he would like to go back to his apartment, but understands why he is here.  No acute distress.  Vital signs stable.  No other complaints.   Past Medical History:  Diagnosis Date  . Arthritis   . BPH (benign prostatic hyperplasia)   . Chicken pox   . DDD (degenerative disc disease), lumbosacral    LS spine with HNP S1 by MRI 8/08  . Depression   . Diabetes mellitus type 2, uncomplicated ([place])   . Diabetes mellitus without complication ([place])   . History  of colonic polyps   . Hypertension   . Nephrolithiasis   . Seizures ([place])   . Sleep apnea    Past Surgical History:  Procedure Laterality Date  . CATARACT EXTRACTION, BILATERAL    . COLON SURGERY  01/2002   colonoscopy with polypectomy  . JOINT REPLACEMENT    . ORIF HIP FRACTURE Right 12/2011  . PROSTATE SURGERY    . TONSILLECTOMY      No Known Allergies  Allergies as of 08/16/2017   No Known Allergies     Medication List        Accurate as of 08/16/17 11:59 PM. Always use your most recent med list.          acetaminophen 650 MG suppository Commonly known as:  TYLENOL Place 650 mg rectally every 4 (four) hours as needed for fever. For fever if unable to take med po.   ciprofloxacin 250 MG tablet Commonly known as:  CIPRO Take 250 mg by mouth 2 (two) times daily.   citalopram 20 MG tablet Commonly known as:  CELEXA Take 20 mg daily by mouth.   DERMACLOUD Crea Apply ointment topically once daily   docusate sodium 100 MG capsule Commonly known as:  COLACE Take 100 mg by mouth 2 (two) times daily.   ELIQUIS 2.5 MG Tabs tablet Generic drug:  apixaban Take 2.5 mg by mouth 2 (two) times daily.   feeding supplement (PRO-STAT SUGAR FREE 64) Liqd Take 30 mLs 2 (two) times daily between meals by  mouth.   finasteride 5 MG tablet Commonly known as:  PROSCAR Take 5 mg daily by mouth.   glimepiride 2 MG tablet Commonly known as:  AMARYL Take 2 mg daily with breakfast by mouth.   levETIRAcetam 750 MG tablet Commonly known as:  KEPPRA Take 750 mg by mouth 2 (two) times daily.   LUMIGAN 0.01 % Soln Generic drug:  bimatoprost Place 1 drop into both eyes daily.   Melatonin 3 MG Tabs Take 1 tablet at bedtime by mouth.   metoprolol tartrate 25 MG tablet Commonly known as:  LOPRESSOR Take 25 mg daily by mouth.   multivitamin with minerals Tabs tablet Take 1 tablet by mouth daily.   traZODone 50 MG tablet Commonly known as:  DESYREL Take 50 mg at bedtime  by mouth.       Review of Systems  Constitutional: Negative for activity change, appetite change, chills, diaphoresis and fever.  HENT: Negative for congestion, mouth sores, nosebleeds, postnasal drip, sneezing, sore throat, trouble swallowing and voice change.   Respiratory: Negative for apnea, cough, choking, chest tightness, shortness of breath and wheezing.   Cardiovascular: Negative for chest pain, palpitations and leg swelling.  Gastrointestinal: Negative for abdominal distention, abdominal pain, constipation, diarrhea and nausea.  Genitourinary: Negative for difficulty urinating, dysuria, frequency and urgency.  Musculoskeletal: Negative for back pain, gait problem and myalgias. Arthralgias: typical arthritis.  Skin: Negative for color change, pallor, rash and wound.  Neurological: Positive for weakness. Negative for dizziness, tremors, syncope, speech difficulty, numbness and headaches.  Psychiatric/Behavioral: Negative for agitation and behavioral problems.  All other systems reviewed and are negative.   Immunization History  Administered Date(s) Administered  . Influenza-Unspecified 05/16/2017   Pertinent  Health Maintenance Due  Topic Date Due  . FOOT EXAM  04/17/1932  . OPHTHALMOLOGY EXAM  04/17/1932  . URINE MICROALBUMIN  04/17/1932  . PNA vac Low Risk Adult (1 of 2 - PCV13) 04/17/1989  . HEMOGLOBIN A1C  04/08/2016  . INFLUENZA VACCINE  03/28/2017   No flowsheet data found. Functional Status Survey:    Vitals:   08/16/17 1345  BP: (!) 119/59  Pulse: 78  Resp: 20  Temp: 98.4 F (36.9 C)  TempSrc: Oral  SpO2: 94%  Weight: 212 lb 8 oz (96.4 kg)  Height: 6' 2" (1.88 m)   Body mass index is 27.28 kg/m. Physical Exam  Constitutional: He is oriented to person, place, and time. Vital signs are normal. He appears well-developed and well-nourished. He is active and cooperative. He does not appear ill. No distress.  HENT:  Head: Normocephalic and atraumatic.    Mouth/Throat: Uvula is midline, oropharynx is clear and moist and mucous membranes are normal. Mucous membranes are not pale, not dry and not cyanotic.  Eyes: Conjunctivae, EOM and lids are normal. Pupils are equal, round, and reactive to light.  Neck: Trachea normal, normal range of motion and full passive range of motion without pain. Neck supple. No JVD present. No tracheal deviation, no edema and no erythema present. No thyromegaly present.  Cardiovascular: Normal rate, regular rhythm, normal heart sounds, intact distal pulses and normal pulses. Exam reveals no gallop, no distant heart sounds and no friction rub.  No murmur heard. Pulses:      Dorsalis pedis pulses are 2+ on the right side, and 2+ on the left side.  No edema  Pulmonary/Chest: Effort normal and breath sounds normal. No accessory muscle usage. No respiratory distress. He has no decreased breath sounds. He has no  wheezes. He has no rhonchi. He has no rales. He exhibits no tenderness.  Abdominal: Soft. Normal appearance and bowel sounds are normal. He exhibits no distension and no ascites. There is no tenderness.  Musculoskeletal: Normal range of motion. He exhibits no edema or tenderness.  Expected osteoarthritis, stiffness; Bilateral Calves soft, supple. Negative Homan's Sign. B- pedal pulses equal  Neurological: He is alert and oriented to person, place, and time. He has normal strength. He exhibits abnormal muscle tone. Coordination and gait abnormal.  Skin: Skin is warm, dry and intact. He is not diaphoretic. No cyanosis. No pallor. Nails show no clubbing.  Psychiatric: He has a normal mood and affect. His speech is normal and behavior is normal. Judgment and thought content normal. Cognition and memory are normal.  Nursing note and vitals reviewed.   Labs reviewed: Recent Labs    06/24/17 0349 06/25/17 0410 06/26/17 0154  NA 138 139 140  K 3.9 3.8 3.8  CL 107 109 109  CO2 _0 GLUCOSE 143* 182* 152*  BUN 29*  27* 25*  CREATININE 1.39* 1.30* 1.31*  CALCIUM 8.1* 8.0* 8.6*   Recent Labs    06/23/17 1000 06/24/17 0349  AST 51* 42*  ALT 27 23  ALKPHOS 59 39  BILITOT 1.2 1.1  PROT 7.5 5.8*  ALBUMIN 3.9 2.9*   Recent Labs    06/24/17 0349 06/25/17 0410 06/26/17 0154  WBC 6.9 6.9 8.6  NEUTROABS  --   --  6.1  HGB 12.9* 12.7* 13.3  HCT 38.7* 36.8* 40.0  MCV 89.3 87.4 88.8  PLT 135* 136* 157   Lab Results  Component Value Date   TSH 3.92 07/17/2014   Lab Results  Component Value Date   HGBA1C 7.5 (H) 10/10/2015   Lab Results  Component Value Date   CHOL 117 07/17/2014   HDL 32 (L) 07/17/2014   LDLCALC 65 07/17/2014   TRIG 99 07/17/2014    Significant Diagnostic Results in last 30 days:  No results found.  Assessment/Plan 1. Generalized muscle weakness 2. History of falling  Continue PT/OT  Continue exercises as taught by PT/OT  Ambulate with walker and assistance  3. Paroxysmal atrial fibrillation (HCC)  Stable  Continue metoprolol tartrate 25 mg p.o. daily  Continue Eliquis 2.5 mg p.o. twice daily  4. Type 2 diabetes mellitus with chronic kidney disease, without long-term current use of insulin, unspecified CKD stage (HCC)  Stable  Continue Amaryl 2 mg p.o. daily with breakfast  FS BS every morning and as needed  5. OSA on CPAP  Stable  CPAP nightly and as needed  6. Benign prostatic hyperplasia without lower urinary tract symptoms  Stable  Continue finasteride 5 mg p.o. daily  7. Major depressive disorder with single episode, remission status unspecified  Stable  Continue Celexa 20 mg p.o. daily  Continue trazodone 50 mg p.o. nightly  8. Convulsions, unspecified convulsion type (HCC)  Stable  Continue Keppra 750 mg tablet p.o. twice daily  9. Hypertension, unspecified type  Stable  Continue metoprolol tartrate 25 mg p.o. daily  10. CKD (chronic kidney disease), stage III (HCC)  Renally adjust medications as  appropriate  11. Protein-calorie malnutrition, unspecified severity (HCC)  Pro-stat 30 mL p.o. twice daily between meals  Continue multivitamin 1 tablet p.o. daily  12.  Glaucoma of both eyes, unspecified glaucoma type  Stable  Continue Lumigan 0.01% 1 drop in each eye daily  13.  Constipation, unspecified constipation type  Stable  Continue  Colace 100 mg p.o. twice daily  14.  Insomnia, unspecified type  Stable  Continue melatonin 3 mg p.o. nightly  Continue trazodone 50 mg p.o. nightly   Family/ staff Communication:   Total Time:  Documentation:  Face to Face:  Family/Phone:   Labs/tests ordered: CBC, met C, TSH, mag, vitamin B12, vitamin D, A1c prior to next visit  Medication list reviewed and assessed for continued appropriateness. Monthly medication orders reviewed and signed.  Vikki Ports, NP-C Geriatrics [facility] [phone], [place] [address] Cell Phone (Mon-Fri 8am-5pm):  [phone] On Call:  [phone] & follow prompts after 5pm & weekends Office Phone:  [phone] Office Fax:  [phone]

## 2017-08-28 ENCOUNTER — Encounter
Admission: RE | Admit: 2017-08-28 | Discharge: 2017-08-28 | Disposition: A | Discharge status: Home / Self Care | Payer: Medicare Other | Source: Ambulatory Visit | Attending: Internal Medicine | Admitting: Internal Medicine

## 2017-08-30 ENCOUNTER — Other Ambulatory Visit
Admission: RE | Admit: 2017-08-30 | Discharge: 2017-08-30 | Disposition: A | Discharge status: Home / Self Care | Payer: Medicare Other | Source: Ambulatory Visit | Attending: Gerontology | Admitting: Gerontology

## 2017-08-30 DIAGNOSIS — I129 Hypertensive chronic kidney disease with stage 1 through stage 4 chronic kidney disease, or unspecified chronic kidney disease: Secondary | ICD-10-CM | POA: Insufficient documentation

## 2017-08-30 LAB — CBC WITH DIFFERENTIAL/PLATELET
BASOS PCT: 1 %
Basophils Absolute: 0.1 10*3/uL (ref 0–0.1)
Eosinophils Absolute: 0.4 10*3/uL (ref 0–0.7)
Eosinophils Relative: 7 %
HEMATOCRIT: 37.8 % — AB (ref 40.0–52.0)
HEMOGLOBIN: 12.5 g/dL — AB (ref 13.0–18.0)
Lymphocytes Relative: 25 %
Lymphs Abs: 1.7 10*3/uL (ref 1.0–3.6)
MCH: 27.7 pg (ref 26.0–34.0)
MCHC: 33 g/dL (ref 32.0–36.0)
MCV: 83.7 fL (ref 80.0–100.0)
MONOS PCT: 10 %
Monocytes Absolute: 0.6 10*3/uL (ref 0.2–1.0)
NEUTROS ABS: 3.8 10*3/uL (ref 1.4–6.5)
NEUTROS PCT: 57 %
Platelets: 222 10*3/uL (ref 150–440)
RBC: 4.52 MIL/uL (ref 4.40–5.90)
RDW: 14.1 % (ref 11.5–14.5)
WBC: 6.6 10*3/uL (ref 3.8–10.6)

## 2017-08-30 LAB — COMPREHENSIVE METABOLIC PANEL
ALBUMIN: 2.9 g/dL — AB (ref 3.5–5.0)
ALK PHOS: 65 U/L (ref 38–126)
ALT: 10 U/L — AB (ref 17–63)
AST: 18 U/L (ref 15–41)
Anion gap: 9 (ref 5–15)
BUN: 23 mg/dL — AB (ref 6–20)
CALCIUM: 9.2 mg/dL (ref 8.9–10.3)
CHLORIDE: 102 mmol/L (ref 101–111)
CO2: 30 mmol/L (ref 22–32)
CREATININE: 1.15 mg/dL (ref 0.61–1.24)
GFR calc non Af Amer: 53 mL/min — ABNORMAL LOW (ref >60)
GLUCOSE: 96 mg/dL (ref 65–99)
Potassium: 4.4 mmol/L (ref 3.5–5.1)
SODIUM: 141 mmol/L (ref 135–145)
Total Bilirubin: 0.6 mg/dL (ref 0.3–1.2)
Total Protein: 6.1 g/dL — ABNORMAL LOW (ref 6.5–8.1)

## 2017-08-30 LAB — MAGNESIUM: Magnesium: 1.8 mg/dL (ref 1.7–2.4)

## 2017-08-30 LAB — TSH: TSH: 3.86 u[IU]/mL (ref 0.350–4.500)

## 2017-08-30 LAB — HEMOGLOBIN A1C
Hgb A1c MFr Bld: 7.5 % — ABNORMAL HIGH (ref 4.8–5.6)
Mean Plasma Glucose: 168.55 mg/dL

## 2017-08-30 LAB — VITAMIN B12: Vitamin B-12: 555 pg/mL (ref 180–914)

## 2017-08-31 LAB — VITAMIN D 25 HYDROXY (VIT D DEFICIENCY, FRACTURES): Vit D, 25-Hydroxy: 38.9 ng/mL (ref 30.0–100.0)

## 2017-09-19 ENCOUNTER — Encounter: Payer: Self-pay | Admitting: Gerontology

## 2017-09-19 ENCOUNTER — Non-Acute Institutional Stay (SKILLED_NURSING_FACILITY): Payer: Medicare Other | Admitting: Gerontology

## 2017-09-19 DIAGNOSIS — I10 Essential (primary) hypertension: Secondary | ICD-10-CM

## 2017-09-19 DIAGNOSIS — Z9989 Dependence on other enabling machines and devices: Secondary | ICD-10-CM | POA: Diagnosis not present

## 2017-09-19 DIAGNOSIS — I480 Paroxysmal atrial fibrillation: Secondary | ICD-10-CM | POA: Diagnosis not present

## 2017-09-19 DIAGNOSIS — G4733 Obstructive sleep apnea (adult) (pediatric): Secondary | ICD-10-CM | POA: Diagnosis not present

## 2017-09-20 NOTE — Assessment & Plan Note (Signed)
Stable. Rate currently controled  On metoprolol. Eliquis 2.5 mg po BID for DVT prophylaxis.

## 2017-09-20 NOTE — Assessment & Plan Note (Signed)
Stable. No recent episodes of hypotension. Pressures controlled on metoprolol only. Pt denies chest pain or shortness of breath

## 2017-09-20 NOTE — Progress Notes (Signed)
Location:    Nursing Home Room Number: 345P Place of Service:  SNF (31) Provider:  Lorenso QuarryShannon Leiani Enright, NP-C  Sparks, Patrick LopeJeffrey D, MD  Patient Care Team: Marguarite ArbourSparks, Patrick D, MD as PCP - General (Internal Medicine)  Extended Emergency Contact Information Primary Emergency Contact: Patrick MomGreg,[place]  [place] Home Phone: [phone] Relation: Son Secondary Emergency Contact: Patrick Chan,Patrick Chan  [place] Mobile Phone: [phone] Relation: Son  Code Status:  full Goals of care: Advanced Directive information Advanced Directives 09/19/2017  Does Patient Have a Medical Advance Directive? No  Type of Advance Directive -  Does patient want to make changes to medical advance directive? -  Copy of Healthcare Power of Attorney in Chart? -     Chief Complaint  Patient presents with  . Medical Management of Chronic Issues    Routine Visit    HPI:  Pt is a 84 y.o. male seen today for medical management of chronic diseases.    Paroxysmal atrial fibrillation (HCC) Stable. Rate currently controled  On metoprolol. Eliquis 2.5 mg po BID for DVT prophylaxis.   HTN (hypertension) Stable. No recent episodes of hypotension. Pressures controlled on metoprolol only. Pt denies chest pain or shortness of breath  OSA on CPAP Stable. No c/o dyspnea    Past Medical History:  Diagnosis Date  . Arthritis   . BPH (benign prostatic hyperplasia)   . Chicken pox   . DDD (degenerative disc disease), lumbosacral    LS spine with HNP S1 by MRI 8/08  . Depression   . Diabetes mellitus type 2, uncomplicated (HCC)   . Diabetes mellitus without complication (HCC)   . History of colonic polyps   . Hypertension   . Nephrolithiasis   . Seizures (HCC)   . Sleep apnea    Past Surgical History:  Procedure Laterality Date  . CATARACT EXTRACTION, BILATERAL    . COLON SURGERY  01/2002   colonoscopy with polypectomy  . JOINT REPLACEMENT    . ORIF HIP FRACTURE Right 12/2011    . PROSTATE SURGERY    . TONSILLECTOMY      No Known Allergies  Allergies as of 09/19/2017   No Known Allergies     Medication List        Accurate as of 09/19/17 11:59 PM. Always use your most recent med list.          acetaminophen 650 MG suppository Commonly known as:  TYLENOL Place 650 mg rectally every 4 (four) hours as needed for fever. For fever if unable to take med po.   citalopram 20 MG tablet Commonly known as:  CELEXA Take 20 mg daily by mouth.   DERMACLOUD Crea Apply ointment topically once daily   docusate sodium 100 MG capsule Commonly known as:  COLACE Take 100 mg by mouth 2 (two) times daily.   ELIQUIS 2.5 MG Tabs tablet Generic drug:  apixaban Take 2.5 mg by mouth 2 (two) times daily.   feeding supplement (PRO-STAT SUGAR FREE 64) Liqd Take 30 mLs 2 (two) times daily between meals by mouth.   finasteride 5 MG tablet Commonly known as:  PROSCAR Take 5 mg daily by mouth.   glimepiride 2 MG tablet Commonly known as:  AMARYL Take 2 mg daily with breakfast by mouth.   levETIRAcetam 750 MG tablet Commonly known as:  KEPPRA Take 750 mg by mouth 2 (two) times daily.   LUMIGAN 0.01 % Soln Generic drug:  bimatoprost Place 1 drop into both eyes daily.  magnesium hydroxide 400 MG/5ML suspension Commonly known as:  MILK OF MAGNESIA Take 30 mLs by mouth every 4 (four) hours as needed for mild constipation. If constipation / no BM for 2 days   Melatonin 3 MG Tabs Take 1 tablet at bedtime by mouth.   metoprolol tartrate 25 MG tablet Commonly known as:  LOPRESSOR Take 25 mg daily by mouth.   multivitamin with minerals Tabs tablet Take 1 tablet by mouth daily.   traZODone 50 MG tablet Commonly known as:  DESYREL Take 50 mg at bedtime by mouth.       Review of Systems  Constitutional: Negative for activity change, appetite change, chills, diaphoresis and fever.  HENT: Negative for congestion, mouth sores, nosebleeds, postnasal drip,  sneezing, sore throat, trouble swallowing and voice change.   Respiratory: Negative for apnea, cough, choking, chest tightness, shortness of breath and wheezing.   Cardiovascular: Negative for chest pain, palpitations and leg swelling.  Gastrointestinal: Negative for abdominal distention, abdominal pain, constipation, diarrhea and nausea.  Genitourinary: Negative for difficulty urinating, dysuria, frequency and urgency.  Musculoskeletal: Negative for back pain, gait problem and myalgias. Arthralgias: typical arthritis.  Skin: Negative for color change, pallor, rash and wound.  Neurological: Positive for weakness. Negative for dizziness, tremors, syncope, speech difficulty, numbness and headaches.  Psychiatric/Behavioral: Negative for agitation and behavioral problems.  All other systems reviewed and are negative.   Immunization History  Administered Date(s) Administered  . Influenza-Unspecified 05/16/2017   Pertinent  Health Maintenance Due  Topic Date Due  . FOOT EXAM  04/17/1932  . OPHTHALMOLOGY EXAM  04/17/1932  . URINE MICROALBUMIN  04[birth year]17/1934  . PNA vac Low Risk Adult (1 of 2 - PCV13) 04/17/1989  . HEMOGLOBIN A1C  02/27/2018  . INFLUENZA VACCINE  Completed   No flowsheet data found. Functional Status Survey:    Vitals:   09/19/17 1045  BP: 137/63  Pulse: 76  Resp: 20  Temp: 98.6 F (37 C)  TempSrc: Oral  SpO2: 90%  Weight: 208 lb 9.6 oz (94.6 kg)  Height: 6\' 2"  (1.88 m)   Body mass index is 26.78 kg/m. Physical Exam  Constitutional: He is oriented to person, place, and time. Vital signs are normal. He appears well-developed and well-nourished. He is active and cooperative. He does not appear ill. No distress.  HENT:  Head: Normocephalic and atraumatic.  Mouth/Throat: Uvula is midline, oropharynx is clear and moist and mucous membranes are normal. Mucous membranes are not pale, not dry and not cyanotic.  Eyes: Conjunctivae, EOM and lids are normal. Pupils are  equal, round, and reactive to light.  Neck: Trachea normal, normal range of motion and full passive range of motion without pain. Neck supple. No JVD present. No tracheal deviation, no edema and no erythema present. No thyromegaly present.  Cardiovascular: Normal rate, regular rhythm, normal heart sounds, intact distal pulses and normal pulses. Exam reveals no gallop, no distant heart sounds and no friction rub.  No murmur heard. Pulses:      Dorsalis pedis pulses are 2+ on the right side, and 2+ on the left side.  No edema  Pulmonary/Chest: Effort normal and breath sounds normal. No accessory muscle usage. No respiratory distress. He has no decreased breath sounds. He has no wheezes. He has no rhonchi. He has no rales. He exhibits no tenderness.  Abdominal: Soft. Normal appearance and bowel sounds are normal. He exhibits no distension and no ascites. There is no tenderness.  Musculoskeletal: Normal range of motion. He exhibits  no edema or tenderness.  Expected osteoarthritis, stiffness; Bilateral Calves soft, supple. Negative Homan's Sign. B- pedal pulses equal  Neurological: He is alert and oriented to person, place, and time. He has normal strength. A cranial nerve deficit and sensory deficit is present. Coordination and gait abnormal.  Skin: Skin is warm, dry and intact. He is not diaphoretic. No cyanosis. No pallor. Nails show no clubbing.  Psychiatric: He has a normal mood and affect. His speech is normal and behavior is normal. Judgment and thought content normal. Cognition and memory are normal.  Nursing note and vitals reviewed.   Labs reviewed: Recent Labs    06/25/17 0410 06/26/17 0154 08/30/17 0500  NA 139 140 141  K 3.8 3.8 4.4  CL 109 109 102  CO2 24 24 30   GLUCOSE 182* 152* 96  BUN 27* 25* 23*  CREATININE 1.30* 1.31* 1.15  CALCIUM 8.0* 8.6* 9.2  MG  --   --  1.8   Recent Labs    06/23/17 1000 06/24/17 0349 08/30/17 0500  AST 51* 42* 18  ALT 27 23 10*  ALKPHOS 59  39 65  BILITOT 1.2 1.1 0.6  PROT 7.5 5.8* 6.1*  ALBUMIN 3.9 2.9* 2.9*   Recent Labs    06/25/17 0410 06/26/17 0154 08/30/17 0500  WBC 6.9 8.6 6.6  NEUTROABS  --  6.1 3.8  HGB 12.7* 13.3 12.5*  HCT 36.8* 40.0 37.8*  MCV 87.4 88.8 83.7  PLT 136* 157 222   Lab Results  Component Value Date   TSH 3.860 08/30/2017   Lab Results  Component Value Date   HGBA1C 7.5 (H) 08/30/2017   Lab Results  Component Value Date   CHOL 117 07/17/2014   HDL 32 (L) 07/17/2014   LDLCALC 65 07/17/2014   TRIG 99 07/17/2014    Significant Diagnostic Results in last 30 days:  No results found.  Assessment/Plan Manning was seen today for medical management of chronic issues.  Diagnoses and all orders for this visit:  Paroxysmal atrial fibrillation (HCC)  Hypertension, unspecified type  OSA on CPAP   above conditions stable  Continue current medication regimen  Monitor for dyspnea  Monitor for unstable B/Ps  Continue restorative nursing program  Family/ staff Communication:   Total Time:  Documentation:  Face to Face:  Family/Phone:   Labs/tests ordered: not due  Medication list reviewed and assessed for continued appropriateness. Monthly medication orders reviewed and signed.  Brynda Rim, NP-C [facility] [phone] [address], [place] [address] Cell Phone (Mon-Fri 8am-5pm):  [phone] On Call:  [phone] & follow prompts after 5pm & weekends Office Phone:  [phone] Office Fax:  [phone]

## 2017-09-20 NOTE — Assessment & Plan Note (Signed)
Stable. No c/o dyspnea

## 2017-09-28 ENCOUNTER — Encounter
Admission: RE | Admit: 2017-09-28 | Discharge: 2017-09-28 | Disposition: A | Discharge status: Home / Self Care | Payer: Medicare Other | Source: Ambulatory Visit | Attending: Internal Medicine | Admitting: Internal Medicine

## 2017-10-17 ENCOUNTER — Encounter: Payer: Self-pay | Admitting: Gerontology

## 2017-10-17 ENCOUNTER — Non-Acute Institutional Stay (SKILLED_NURSING_FACILITY): Payer: Medicare Other | Admitting: Gerontology

## 2017-10-17 DIAGNOSIS — E1122 Type 2 diabetes mellitus with diabetic chronic kidney disease: Secondary | ICD-10-CM

## 2017-10-17 DIAGNOSIS — N400 Enlarged prostate without lower urinary tract symptoms: Secondary | ICD-10-CM

## 2017-10-17 DIAGNOSIS — N183 Chronic kidney disease, stage 3 unspecified: Secondary | ICD-10-CM

## 2017-10-26 ENCOUNTER — Encounter
Admission: RE | Admit: 2017-10-26 | Discharge: 2017-10-26 | Disposition: A | Discharge status: Home / Self Care | Payer: Medicare Other | Source: Ambulatory Visit | Attending: Internal Medicine | Admitting: Internal Medicine

## 2017-11-20 ENCOUNTER — Other Ambulatory Visit: Payer: Self-pay

## 2017-11-20 ENCOUNTER — Encounter: Payer: Self-pay | Admitting: Emergency Medicine

## 2017-11-20 ENCOUNTER — Emergency Department: Payer: Medicare Other

## 2017-11-20 ENCOUNTER — Inpatient Hospital Stay
Admission: EM | Admit: 2017-11-20 | Discharge: 2017-11-26 | DRG: 871 | Disposition: E | Payer: Medicare Other | Attending: Internal Medicine | Admitting: Internal Medicine

## 2017-11-20 DIAGNOSIS — Z823 Family history of stroke: Secondary | ICD-10-CM

## 2017-11-20 DIAGNOSIS — N170 Acute kidney failure with tubular necrosis: Secondary | ICD-10-CM | POA: Diagnosis present

## 2017-11-20 DIAGNOSIS — Z87891 Personal history of nicotine dependence: Secondary | ICD-10-CM

## 2017-11-20 DIAGNOSIS — Z515 Encounter for palliative care: Secondary | ICD-10-CM | POA: Diagnosis present

## 2017-11-20 DIAGNOSIS — G40909 Epilepsy, unspecified, not intractable, without status epilepticus: Secondary | ICD-10-CM | POA: Diagnosis present

## 2017-11-20 DIAGNOSIS — Z66 Do not resuscitate: Secondary | ICD-10-CM | POA: Diagnosis present

## 2017-11-20 DIAGNOSIS — Z8249 Family history of ischemic heart disease and other diseases of the circulatory system: Secondary | ICD-10-CM | POA: Diagnosis not present

## 2017-11-20 DIAGNOSIS — N179 Acute kidney failure, unspecified: Secondary | ICD-10-CM

## 2017-11-20 DIAGNOSIS — N182 Chronic kidney disease, stage 2 (mild): Secondary | ICD-10-CM | POA: Diagnosis present

## 2017-11-20 DIAGNOSIS — J189 Pneumonia, unspecified organism: Secondary | ICD-10-CM | POA: Diagnosis present

## 2017-11-20 DIAGNOSIS — A419 Sepsis, unspecified organism: Secondary | ICD-10-CM | POA: Diagnosis not present

## 2017-11-20 DIAGNOSIS — N400 Enlarged prostate without lower urinary tract symptoms: Secondary | ICD-10-CM | POA: Diagnosis present

## 2017-11-20 DIAGNOSIS — R4182 Altered mental status, unspecified: Secondary | ICD-10-CM | POA: Diagnosis present

## 2017-11-20 DIAGNOSIS — R001 Bradycardia, unspecified: Secondary | ICD-10-CM

## 2017-11-20 DIAGNOSIS — N3000 Acute cystitis without hematuria: Secondary | ICD-10-CM

## 2017-11-20 DIAGNOSIS — A4151 Sepsis due to Escherichia coli [E. coli]: Secondary | ICD-10-CM | POA: Diagnosis present

## 2017-11-20 DIAGNOSIS — Z6826 Body mass index (BMI) 26.0-26.9, adult: Secondary | ICD-10-CM | POA: Diagnosis not present

## 2017-11-20 DIAGNOSIS — R6521 Severe sepsis with septic shock: Secondary | ICD-10-CM | POA: Diagnosis present

## 2017-11-20 DIAGNOSIS — G92 Toxic encephalopathy: Secondary | ICD-10-CM | POA: Diagnosis present

## 2017-11-20 DIAGNOSIS — E43 Unspecified severe protein-calorie malnutrition: Secondary | ICD-10-CM | POA: Diagnosis present

## 2017-11-20 DIAGNOSIS — J9600 Acute respiratory failure, unspecified whether with hypoxia or hypercapnia: Secondary | ICD-10-CM | POA: Diagnosis not present

## 2017-11-20 DIAGNOSIS — I129 Hypertensive chronic kidney disease with stage 1 through stage 4 chronic kidney disease, or unspecified chronic kidney disease: Secondary | ICD-10-CM | POA: Diagnosis present

## 2017-11-20 DIAGNOSIS — Z7984 Long term (current) use of oral hypoglycemic drugs: Secondary | ICD-10-CM

## 2017-11-20 DIAGNOSIS — N390 Urinary tract infection, site not specified: Secondary | ICD-10-CM | POA: Diagnosis present

## 2017-11-20 DIAGNOSIS — J9602 Acute respiratory failure with hypercapnia: Secondary | ICD-10-CM | POA: Diagnosis not present

## 2017-11-20 DIAGNOSIS — E872 Acidosis: Secondary | ICD-10-CM | POA: Diagnosis present

## 2017-11-20 DIAGNOSIS — E861 Hypovolemia: Secondary | ICD-10-CM | POA: Diagnosis present

## 2017-11-20 DIAGNOSIS — E1122 Type 2 diabetes mellitus with diabetic chronic kidney disease: Secondary | ICD-10-CM | POA: Diagnosis present

## 2017-11-20 DIAGNOSIS — J9601 Acute respiratory failure with hypoxia: Secondary | ICD-10-CM | POA: Diagnosis not present

## 2017-11-20 DIAGNOSIS — J9690 Respiratory failure, unspecified, unspecified whether with hypoxia or hypercapnia: Secondary | ICD-10-CM

## 2017-11-20 DIAGNOSIS — Z7189 Other specified counseling: Secondary | ICD-10-CM

## 2017-11-20 DIAGNOSIS — I480 Paroxysmal atrial fibrillation: Secondary | ICD-10-CM | POA: Diagnosis present

## 2017-11-20 DIAGNOSIS — G4730 Sleep apnea, unspecified: Secondary | ICD-10-CM | POA: Diagnosis present

## 2017-11-20 DIAGNOSIS — Z7901 Long term (current) use of anticoagulants: Secondary | ICD-10-CM

## 2017-11-20 LAB — CBC WITH DIFFERENTIAL/PLATELET
Basophils Absolute: 0 10*3/uL (ref 0–0.1)
Basophils Relative: 0 %
Eosinophils Absolute: 0 10*3/uL (ref 0–0.7)
Eosinophils Relative: 0 %
HEMATOCRIT: 40.3 % (ref 40.0–52.0)
Hemoglobin: 12.9 g/dL — ABNORMAL LOW (ref 13.0–18.0)
LYMPHS ABS: 1 10*3/uL (ref 1.0–3.6)
LYMPHS PCT: 7 %
MCH: 26.9 pg (ref 26.0–34.0)
MCHC: 32 g/dL (ref 32.0–36.0)
MCV: 84.2 fL (ref 80.0–100.0)
MONO ABS: 0.3 10*3/uL (ref 0.2–1.0)
Monocytes Relative: 2 %
Neutro Abs: 13.4 10*3/uL — ABNORMAL HIGH (ref 1.4–6.5)
Neutrophils Relative %: 91 %
Platelets: 245 10*3/uL (ref 150–440)
RBC: 4.79 MIL/uL (ref 4.40–5.90)
RDW: 16.7 % — ABNORMAL HIGH (ref 11.5–14.5)
WBC: 14.7 10*3/uL — AB (ref 3.8–10.6)

## 2017-11-20 LAB — COMPREHENSIVE METABOLIC PANEL
ALT: 17 U/L (ref 17–63)
AST: 46 U/L — AB (ref 15–41)
Albumin: 2.9 g/dL — ABNORMAL LOW (ref 3.5–5.0)
Alkaline Phosphatase: 81 U/L (ref 38–126)
Anion gap: 14 (ref 5–15)
BUN: 35 mg/dL — AB (ref 6–20)
CHLORIDE: 104 mmol/L (ref 101–111)
CO2: 20 mmol/L — AB (ref 22–32)
CREATININE: 1.58 mg/dL — AB (ref 0.61–1.24)
Calcium: 9.1 mg/dL (ref 8.9–10.3)
GFR calc Af Amer: 42 mL/min — ABNORMAL LOW (ref >60)
GFR calc non Af Amer: 36 mL/min — ABNORMAL LOW (ref >60)
Glucose, Bld: 250 mg/dL — ABNORMAL HIGH (ref 65–99)
Potassium: 4.4 mmol/L (ref 3.5–5.1)
SODIUM: 138 mmol/L (ref 135–145)
Total Bilirubin: 0.9 mg/dL (ref 0.3–1.2)
Total Protein: 6.9 g/dL (ref 6.5–8.1)

## 2017-11-20 LAB — BLOOD GAS, VENOUS
Acid-base deficit: 1 mmol/L (ref 0.0–2.0)
BICARBONATE: 22.2 mmol/L (ref 20.0–28.0)
DELIVERY SYSTEMS: POSITIVE
FIO2: 0.24
O2 Saturation: 75.2 %
PCO2 VEN: 32 mmHg — AB (ref 44.0–60.0)
PO2 VEN: 38 mmHg (ref 32.0–45.0)
Patient temperature: 37
pH, Ven: 7.45 — ABNORMAL HIGH (ref 7.250–7.430)

## 2017-11-20 LAB — URINALYSIS, COMPLETE (UACMP) WITH MICROSCOPIC
BACTERIA UA: NONE SEEN
Bilirubin Urine: NEGATIVE
GLUCOSE, UA: NEGATIVE mg/dL
KETONES UR: NEGATIVE mg/dL
Nitrite: NEGATIVE
PH: 5 (ref 5.0–8.0)
Protein, ur: 100 mg/dL — AB
SPECIFIC GRAVITY, URINE: 1.019 (ref 1.005–1.030)
SQUAMOUS EPITHELIAL / LPF: NONE SEEN

## 2017-11-20 LAB — LACTIC ACID, PLASMA
LACTIC ACID, VENOUS: 2.3 mmol/L — AB (ref 0.5–1.9)
Lactic Acid, Venous: 6.1 mmol/L (ref 0.5–1.9)

## 2017-11-20 LAB — GLUCOSE, CAPILLARY
GLUCOSE-CAPILLARY: 220 mg/dL — AB (ref 65–99)
Glucose-Capillary: 203 mg/dL — ABNORMAL HIGH (ref 65–99)
Glucose-Capillary: 224 mg/dL — ABNORMAL HIGH (ref 65–99)
Glucose-Capillary: 221 mg/dL — ABNORMAL HIGH (ref 65–99)

## 2017-11-20 LAB — TROPONIN I: Troponin I: 0.05 ng/mL (ref <0.03)

## 2017-11-20 LAB — MRSA PCR SCREENING: MRSA by PCR: NEGATIVE

## 2017-11-20 MED ORDER — SODIUM CHLORIDE 0.9 % IV SOLN
INTRAVENOUS | Status: DC
  Administered 2017-11-20 – 2017-11-22 (×5): via INTRAVENOUS

## 2017-11-20 MED ORDER — PIPERACILLIN-TAZOBACTAM 3.375 G IVPB
3.3750 g | Freq: Three times a day (TID) | INTRAVENOUS | Status: DC
  Administered 2017-11-20 (×2): 3.375 g via INTRAVENOUS
  Filled 2017-11-20 (×2): qty 50

## 2017-11-20 MED ORDER — ACETAMINOPHEN 650 MG RE SUPP
650.0000 mg | Freq: Once | RECTAL | Status: AC
  Administered 2017-11-20: 650 mg via RECTAL
  Filled 2017-11-20: qty 1

## 2017-11-20 MED ORDER — SODIUM CHLORIDE 0.9 % IV BOLUS (SEPSIS)
1000.0000 mL | Freq: Once | INTRAVENOUS | Status: AC
  Administered 2017-11-20: 1000 mL via INTRAVENOUS

## 2017-11-20 MED ORDER — ACETAMINOPHEN 325 MG PO TABS
650.0000 mg | ORAL_TABLET | Freq: Four times a day (QID) | ORAL | Status: DC | PRN
  Administered 2017-11-21: 650 mg via ORAL
  Filled 2017-11-20: qty 2

## 2017-11-20 MED ORDER — ONDANSETRON HCL 4 MG/2ML IJ SOLN: 4.0000 mg | Freq: Four times a day (QID) | INTRAMUSCULAR | Status: DC | PRN

## 2017-11-20 MED ORDER — TRAZODONE HCL 50 MG PO TABS
50.0000 mg | ORAL_TABLET | Freq: Every day | ORAL | Status: DC
  Administered 2017-11-20: 50 mg via ORAL
  Filled 2017-11-20: qty 1

## 2017-11-20 MED ORDER — ORAL CARE MOUTH RINSE
15.0000 mL | Freq: Two times a day (BID) | OROMUCOSAL | Status: DC
  Administered 2017-11-20 – 2017-11-23 (×6): 15 mL via OROMUCOSAL

## 2017-11-20 MED ORDER — VANCOMYCIN HCL IN DEXTROSE 1-5 GM/200ML-% IV SOLN
1000.0000 mg | Freq: Once | INTRAVENOUS | Status: AC
  Administered 2017-11-20: 1000 mg via INTRAVENOUS
  Filled 2017-11-20: qty 200

## 2017-11-20 MED ORDER — VANCOMYCIN HCL IN DEXTROSE 1-5 GM/200ML-% IV SOLN
1000.0000 mg | INTRAVENOUS | Status: DC
  Administered 2017-11-20: 1000 mg via INTRAVENOUS
  Filled 2017-11-20: qty 200

## 2017-11-20 MED ORDER — DOPAMINE-DEXTROSE 3.2-5 MG/ML-% IV SOLN
5.0000 ug/kg/min | INTRAVENOUS | Status: DC
  Administered 2017-11-20: 5 ug/kg/min via INTRAVENOUS
  Filled 2017-11-20: qty 250

## 2017-11-20 MED ORDER — MELATONIN 3 MG PO TABS: 1.0000 | ORAL_TABLET | Freq: Every day | ORAL | Status: DC

## 2017-11-20 MED ORDER — PRO-STAT SUGAR FREE PO LIQD: 30.0000 mL | Freq: Two times a day (BID) | ORAL | Status: DC

## 2017-11-20 MED ORDER — MELATONIN 5 MG PO TABS
5.0000 mg | ORAL_TABLET | Freq: Every day | ORAL | Status: DC
  Filled 2017-11-20: qty 1

## 2017-11-20 MED ORDER — HEPARIN SODIUM (PORCINE) 5000 UNIT/ML IJ SOLN
5000.0000 [IU] | Freq: Three times a day (TID) | INTRAMUSCULAR | Status: DC
  Administered 2017-11-20 – 2017-11-22 (×5): 5000 [IU] via SUBCUTANEOUS
  Filled 2017-11-20 (×5): qty 1

## 2017-11-20 MED ORDER — INSULIN ASPART 100 UNIT/ML ~~LOC~~ SOLN
0.0000 [IU] | Freq: Three times a day (TID) | SUBCUTANEOUS | Status: DC
  Administered 2017-11-20: 3 [IU] via SUBCUTANEOUS
  Administered 2017-11-21 (×3): 1 [IU] via SUBCUTANEOUS
  Administered 2017-11-22: 2 [IU] via SUBCUTANEOUS
  Filled 2017-11-20 (×5): qty 1

## 2017-11-20 MED ORDER — LORAZEPAM 2 MG/ML IJ SOLN
0.5000 mg | Freq: Once | INTRAMUSCULAR | Status: AC
  Administered 2017-11-20: 0.5 mg via INTRAVENOUS
  Filled 2017-11-20: qty 1

## 2017-11-20 MED ORDER — ONDANSETRON HCL 4 MG PO TABS: 4.0000 mg | ORAL_TABLET | Freq: Four times a day (QID) | ORAL | Status: DC | PRN

## 2017-11-20 MED ORDER — SODIUM CHLORIDE 0.9 % IV SOLN
750.0000 mg | Freq: Two times a day (BID) | INTRAVENOUS | Status: DC
  Administered 2017-11-20 – 2017-11-23 (×6): 750 mg via INTRAVENOUS
  Filled 2017-11-20 (×8): qty 7.5

## 2017-11-20 MED ORDER — ENOXAPARIN SODIUM 40 MG/0.4ML ~~LOC~~ SOLN: 40.0000 mg | SUBCUTANEOUS | Status: DC

## 2017-11-20 MED ORDER — ACETAMINOPHEN 650 MG RE SUPP
650.0000 mg | RECTAL | Status: DC | PRN
  Administered 2017-11-21: 19:00:00 650 mg via RECTAL

## 2017-11-20 MED ORDER — PIPERACILLIN-TAZOBACTAM 3.375 G IVPB 30 MIN
3.3750 g | Freq: Once | INTRAVENOUS | Status: AC
  Administered 2017-11-20: 3.375 g via INTRAVENOUS
  Filled 2017-11-20: qty 50

## 2017-11-20 MED ORDER — ACETAMINOPHEN 650 MG RE SUPP
650.0000 mg | Freq: Four times a day (QID) | RECTAL | Status: DC | PRN
  Filled 2017-11-20: qty 1

## 2017-11-20 NOTE — Progress Notes (Signed)
[facility] CH found patient and several nurses in the patients room interacting with the patient. CH provided a prayer shawl for patient. Patient is hard of hearing and had one hearing aid in on the side he was on. Nurse stated that patient was a bit confused. [facility] and [facility] prayer shaw[facility] across him. CH provided prayer, emotional support, and a prayer shawl. CH available for follow-up.

## 2017-11-20 NOTE — ED Notes (Signed)
[facility] ED [facility]. EMS initially called out for UTI/hyperglycemia but report en route to ED pt became less responsive with HR in the 30s. Pt paced via pads by EMS, successful capture with rate of 70. HR upon arrival to ED 70s without need for further pacing. Pt confused, lethargic, pulling at lines and trying to roll out of bed. Placed on BiPAP upon arrival for tachypnea and respiratory distress. Ax temp 103.1 per EMS.

## 2017-11-20 NOTE — Progress Notes (Signed)
CODE SEPSIS - PHARMACY COMMUNICATION  **Broad Spectrum Antibiotics should be administered within 1 hour of Sepsis diagnosis**  Time Code Sepsis Called/Page Received: 1030  Antibiotics Ordered: Zosyn and Vancomycin  Time of 1st antibiotic administration: 1038   Additional action taken by pharmacy: N/A  If necessary, Name of Provider/Nurse Contacted: N/A   Cleopatra CedarStephanie Kimmarie Pascale ,PharmD Pharmacy Resident  06/28/18  11:20 AM

## 2017-11-20 NOTE — Assessment & Plan Note (Signed)
Stable.  Renal values last checked in January were stable.  Medication doses renally adjusted as appropriate

## 2017-11-20 NOTE — Consult Note (Signed)
[facility] Pulmonary Medicine Consultation      Name: Patrick Chan MRN: [id number] DOB: 12/19/1921    ADMISSION DATE:  10/26/2017 CONSULTATION DATE:  10/30/2017  REFERRING MD :  Katha Hamming, MD   CHIEF COMPLAINT:     Bradycardia   HISTORY OF PRESENT ILLNESS    Patrick Chan  is a 84 y.o. male with a known history of seizure disorder, hypertension, paroxysmal atrial fibrillation who resides at [facility] skilled nursing presented to the Ed with  fever, lethargy.  According to family patient noted to have shaking with chills yesterday and this AM he had a fever, altered mental status and bradycardia with heart rate of 29. Hence he was brought to the ED for evaluation. In the ED he was found to be pyrexial with a temperature of 103.1. He wash hypotensive with blood pressure up to 95/52. He was placed on 100% NRM oxygen and given 3 liters of IVF. He was cultured, started on antibiotics and transferred tot he ICU for further management. The patient's son was available and changed the code status to DNR.    SIGNIFICANT EVENTS    Bradycardia    PAST MEDICAL HISTORY    :  Past Medical History:  Diagnosis Date  . Arthritis   . BPH (benign prostatic hyperplasia)   . Chicken pox   . DDD (degenerative disc disease), lumbosacral    LS spine with HNP S1 by MRI 8/08  . Depression   . Diabetes mellitus type 2, uncomplicated (HCC)   . Diabetes mellitus without complication (HCC)   . History of colonic polyps   . Hypertension   . Nephrolithiasis   . Seizures (HCC)   . Sleep apnea    Past Surgical History:  Procedure Laterality Date  . CATARACT EXTRACTION, BILATERAL    . COLON SURGERY  01/2002   colonoscopy with polypectomy  . JOINT REPLACEMENT    . ORIF HIP FRACTURE Right 12/2011  . PROSTATE SURGERY    . TONSILLECTOMY     Prior to Admission medications   Medication Sig Start Date End Date Taking? Authorizing Provider  acetaminophen (TYLENOL)  650 MG suppository Place 650 mg rectally every 4 (four) hours as needed for fever. Give 1 suppository Q 4 hours for fever if unable to take med PO. Maximum dose for 24 hours is 3,000 mg for all sources of Acetaminophen/ Tylenol   Yes [provider]  Amino Acids-Protein Hydrolys (FEEDING SUPPLEMENT, PRO-STAT SUGAR FREE 64,) LIQD Take 30 mLs 2 (two) times daily between meals by mouth.   Yes [provider]  apixaban (ELIQUIS) 2.5 MG TABS tablet Take 2.5 mg by mouth 2 (two) times daily.    Yes [provider]  citalopram (CELEXA) 20 MG tablet Take 20 mg daily by mouth.    Yes [provider]  docusate sodium (COLACE) 100 MG capsule Take 100 mg by mouth 2 (two) times daily.    Yes [provider]  finasteride (PROSCAR) 5 MG tablet Take 5 mg daily by mouth.    Yes [provider]  glimepiride (AMARYL) 2 MG tablet Take 2 mg daily with breakfast by mouth.    Yes [provider]  Infant Care Products (DERMACLOUD) CREA Apply ointment topically once daily   Yes [provider]  levETIRAcetam (KEPPRA) 750 MG tablet Take 750 mg by mouth 2 (two) times daily.    Yes [provider]  LUMIGAN 0.01 % SOLN Place 1 drop into both eyes  daily.  05/14/17  Yes [provider]  magnesium hydroxide (MILK OF MAGNESIA) 400 MG/5ML suspension Take 30 mLs by mouth every 4 (four) hours as needed for mild constipation. If constipation / no BM for 2 days   Yes [provider]  Melatonin 3 MG TABS Take 1 tablet at bedtime by mouth.   Yes [provider]  metoprolol tartrate (LOPRESSOR) 25 MG tablet Take 25 mg daily by mouth.   Yes [provider]  Multiple Vitamin (MULTIVITAMIN WITH MINERALS) TABS tablet Take 1 tablet by mouth daily.   Yes [provider]  pravastatin (PRAVACHOL) 10 MG tablet Take 10 mg by mouth once a week. On Sunday   Yes [provider]  traZODone (DESYREL) 50 MG tablet Take 50 mg  at bedtime by mouth.    Yes [provider]   No Known Allergies   FAMILY HISTORY   Family History  Problem Relation Age of Onset  . Stroke Mother   . Heart attack Mother   . Heart attack Father       SOCIAL HISTORY    reports that he has quit smoking. He has never used smokeless tobacco. He reports that he does not drink alcohol or use drugs.  ROS Son reported that his father is mostly immobile but able to sit in his chair and watch television.  He has had a good appetite until yesterday.   VITAL SIGNS    Temp:  [98.3 F (36.8 C)-103.4 F (39.7 C)] 98.3 F (36.8 C) (03/26 1326) Pulse Rate:  [36-93] 81 (03/26 1730) Resp:  [17-45] 19 (03/26 1730) BP: (79-140)/(39-72) 117/56 (03/26 1730) SpO2:  [82 %-100 %] 93 % (03/26 1730) Weight:  [203 lb 14.8 oz (92.5 kg)-210 lb (95.3 kg)] 203 lb 14.8 oz (92.5 kg) (03/26 1326) HEMODYNAMICS:   VENTILATOR SETTINGS:   INTAKE / OUTPUT:  Intake/Output Summary (Last 24 hours) at 11/15/2017 1825 Last data filed at 11/01/2017 1718 Gross per 24 hour  Intake 3122.94 ml  Output 250 ml  Net 2872.94 ml       PHYSICAL EXAM    GENERAL: severely lethargic, smiled but not interactive, appears very ill EYES: Pupils equal, round, reactive to light   No scleral icterus.  HEENT: Head atraumatic, normocephalic. Oropharynx and nasopharynx clear but dry.  NEC' no jugular venous distention. No thyroid enlargement, no tenderness.  LUNGS: Diminshed Breath sounds bilaterally  but no rales or wheeze.Marland Kitchen.  CARDIOVASCULAR: S1, S2 normal. No murmurs, rubs, or gallops.  ABDOMEN: Soft, r, nondistended. Bowel sounds present. No organomegaly or mass.  EXTREMITIES: No pedal edema, cyanosis, or clubbing.  NEUROLOGIC: lethargic, unable to follow direction for full neurological exam. SKIN: No obvious rash, lesion, or ulcer, warm and dry.       LABS   LABS:  CBC Recent Labs  Lab 11/04/2017 1026  WBC 14.7*  HGB 12.9*  HCT 40.3  PLT 245    Coag's No results for input(s): APTT, INR in the last 168 hours. BMET Recent Labs  Lab 11/08/2017 1026  NA 138  K 4.4  CL 104  CO2 20*  BUN 35*  CREATININE 1.58*  GLUCOSE 250*   Electrolytes Recent Labs  Lab 11/19/2017 1026  CALCIUM 9.1   Sepsis Markers Recent Labs  Lab 11/06/2017 1025 10/27/2017 1342  LATICACIDVEN 6.1* 2.3*   ABG No results for input(s): PHART, PCO2ART, PO2ART in the last 168 hours. Liver Enzymes Recent Labs  Lab 11/19/2017 1026  AST 46*  ALT 17  ALKPHOS 81  BILITOT 0.9  ALBUMIN 2.9*   Cardiac Enzymes Recent Labs  Lab Dec 18, 2017 1026  TROPONINI 0.05*   Glucose Recent Labs  Lab 2017-12-18 1329 12-18-2017 1703  GLUCAP 203* 220*     Recent Results (from the past 240 hour(s))  MRSA PCR Screening     Status: None   Collection Time: 2017-12-18  1:37 PM  Result Value Ref Range Status   MRSA by PCR NEGATIVE NEGATIVE Final    Comment:        The GeneXpert MRSA Assay (FDA approved for NASAL specimens only), is one component of a comprehensive MRSA colonization surveillance program. It is not intended to diagnose MRSA infection nor to guide or monitor treatment for MRSA infections. Performed at [facility], [address] [place], [place] [address]      Current Facility-Administered Medications:  .  0.9 %  sodium chloride infusion, , Intravenous, Continuous, Katha Hamming, MD, Last Rate: 150 mL/hr at 12/18/17 1356 .  acetaminophen (TYLENOL) suppository 650 mg, 650 mg, Rectal, Q4H PRN, Katha Hamming, MD .  acetaminophen (TYLENOL) tablet 650 mg, 650 mg, Oral, Q6H PRN **OR** acetaminophen (TYLENOL) suppository 650 mg, 650 mg, Rectal, Q6H PRN, Katha Hamming, MD .  DOPamine (INTROPIN) 800 mg in dextrose 5 % 250 mL (3.2 mg/mL) infusion, 5 mcg/kg/min, Intravenous, Titrated, Annett Fabian, MD, Last Rate: 8.7 mL/hr at December 18, 2017 1606, 5 mcg/kg/min at 12-18-2017 1606 .  feeding supplement (PRO-STAT SUGAR FREE 64) liquid 30  mL, 30 mL, Oral, BID BM, Luberta Mutter, Snehalatha, MD .  heparin injection 5,000 Units, 5,000 Units, Subcutaneous, Q8H, Devora Tortorella A, MD .  insulin aspart (novoLOG) injection 0-9 Units, 0-9 Units, Subcutaneous, TID WC, Katha Hamming, MD, 3 Units at 12-18-17 1718 .  levETIRAcetam (KEPPRA) 750 mg in sodium chloride 0.9 % 100 mL IVPB, 750 mg, Intravenous, Q12H, Katha Hamming, MD, Stopped at 18-Dec-2017 1424 .  [DISCONTINUED] ondansetron (ZOFRAN) tablet 4 mg, 4 mg, Oral, Q6H PRN **OR** ondansetron (ZOFRAN) injection 4 mg, 4 mg, Intravenous, Q6H PRN, Katha Hamming, MD .  piperacillin-tazobactam (ZOSYN) IVPB 3.375 g, 3.375 g, Intravenous, Q8H, Shuder, Kenton, RPH, Stopped at 12/18/2017 1718 .  vancomycin (VANCOCIN) IVPB 1000 mg/200 mL premix, 1,000 mg, Intravenous, Q24H, Shuder, Stephanie, [facility], Last Rate: 200 mL/hr at 12/18/2017 1707, 1,000 mg at 2017-12-18 1707  IMAGING    Dg Chest Port 1 View  Result Date: Dec 18, 2017 CLINICAL DATA:  Fever.  Weakness.  Combative.  Shortness of breath. EXAM: PORTABLE CHEST 1 VIEW COMPARISON:  06/26/2017. FINDINGS: Mediastinum and hilar structures normal. Cardiomegaly with normal pulmonary vascularity. Low lung volumes with mild basilar atelectasis. No pleural effusion or pneumothorax. IMPRESSION: Low lung volumes with mild basilar atelectasis. Exam otherwise unremarkable. Electronically Signed   By: Maisie Fus  Register   On: 2017/12/18 11:07       MAJOR EVENTS/TEST RESULTS: Per HPI  INDWELLING DEVICES:: None  MICRO DATA: MRSA PCR negative Urine pending Bloodpending Resp   ANTIMICROBIALS:  3/26 Zosyn; Vancomycin   ASSESSMENT/PLAN    1. Severe sepsis with shock now requiring vasopressor 2. Acute metabolic encephalopathy secondary to severe sepsis 3. UTI 4. Bilateral Atelectatics 5. Hypovolemia 6. Symptomatic bradycardia from sepsis- lactic acidosis improving with hydration 7. Acute Kidney Injury from sepsis/UTI 8. Hx of Seizure  disorder  Plan: 1. Continue IVF and vasopressor. Dopamine to improve HR and perfusion 2. Continue Zosyn and Vancomycin pending ID and sensitivity of urine culture. ? Bacteremia. 3. Follow up lactic acid marker 4. Anticonvulsive therapy/precaution 5. Pyrexia  control/ pain control 6. Palliative medicine consult 7. DVT prophylaxis 8. NPO  I have updated the patient's son on the patient's condition and goals of therapy.   I have personally obtained a history, examined the patient, evaluated laboratory and independently reviewed  imaging results, formulated the assessment and plan and placed orders.  The Patient requires high complexity decision making for assessment and support, frequent evaluation and titration of therapies, application of advanced monitoring technologies and extensive interpretation of multiple databases. Critical Care Time devoted to patient care services described in this note is 40 minutes.   Overall, patient is critically ill, prognosis is guarded. Patient at high risk for cardiac arrest and death.   Jackson Latino, M.D

## 2017-11-20 NOTE — Progress Notes (Signed)
Location:    Nursing Home Room Number: 315P Place of Service:  SNF (31) Provider:  Lorenso Quarry, NP-C  Sparks, Duane Lope, MD  Patient Care Team: Marguarite Arbour, MD as PCP - General (Internal Medicine)  Extended Emergency Contact Information Primary Emergency Contact: Renelda Mom of [place] Home Phone: [phone] Relation: Son Secondary Emergency Contact: Sherrie George [place] of [place] Mobile Phone: [phone] Relation: Son  Code Status: Full Goals of care: Advanced Directive information Advanced Directives 10/17/2017  Does Patient Have a Medical Advance Directive? No  Type of Advance Directive -  Does patient want to make changes to medical advance directive? -  Copy of Healthcare Power of Attorney in Chart? -     Chief Complaint  Patient presents with  . Medical Management of Chronic Issues    Routine Visit    HPI:  Pt is a 84 y.o. male seen today for medical management of chronic diseases.    Type 2 diabetes mellitus (HCC) Stable.  Blood sugars controlled on glimepiride 2 mg p.o. once daily.  Daily blood sugar checks at 6 AM and as needed.  Typical blood sugars range between 81 and 150, though typically less than 120.  Patient is on a carb controlled diet and does not typically eat outside foods.  A1c check in January was 7.3.  No complications noted  CKD (chronic kidney disease), stage III (HCC) Stable.  Renal values last checked in January were stable.  Medication doses renally adjusted as appropriate  BPH (benign prostatic hyperplasia) Stable.  No current or recent issues with voiding.  No reports of retention, urgency or frequency.  Symptoms controlled with Proscar 5 mg p.o. daily    Past Medical History:  Diagnosis Date  . Arthritis   . BPH (benign prostatic hyperplasia)   . Chicken pox   . DDD (degenerative disc disease), lumbosacral    LS spine with HNP S1 by MRI 8/08  . Depression   . Diabetes mellitus type  2, uncomplicated (HCC)   . Diabetes mellitus without complication (HCC)   . History of colonic polyps   . Hypertension   . Nephrolithiasis   . Seizures (HCC)   . Sleep apnea    Past Surgical History:  Procedure Laterality Date  . CATARACT EXTRACTION, BILATERAL    . COLON SURGERY  01/2002   colonoscopy with polypectomy  . JOINT REPLACEMENT    . ORIF HIP FRACTURE Right 12/2011  . PROSTATE SURGERY    . TONSILLECTOMY      No Known Allergies  Allergies as of 10/17/2017   No Known Allergies     Medication List        Accurate as of 10/17/17 11:59 PM. Always use your most recent med list.          acetaminophen 650 MG suppository Commonly known as:  TYLENOL Place 650 mg rectally every 4 (four) hours as needed for fever. Give 1 suppository Q 4 hours for fever if unable to take med PO. Maximum dose for 24 hours is 3,000 mg for all sources of Acetaminophen/ Tylenol   citalopram 20 MG tablet Commonly known as:  CELEXA Take 20 mg daily by mouth.   DERMACLOUD Crea Apply ointment topically once daily   docusate sodium 100 MG capsule Commonly known as:  COLACE Take 100 mg by mouth 2 (two) times daily.   ELIQUIS 2.5 MG Tabs tablet Generic drug:  apixaban Take 2.5 mg by mouth 2 (two) times daily.  feeding supplement (PRO-STAT SUGAR FREE 64) Liqd Take 30 mLs 2 (two) times daily between meals by mouth.   finasteride 5 MG tablet Commonly known as:  PROSCAR Take 5 mg daily by mouth.   glimepiride 2 MG tablet Commonly known as:  AMARYL Take 2 mg daily with breakfast by mouth.   levETIRAcetam 750 MG tablet Commonly known as:  KEPPRA Take 750 mg by mouth 2 (two) times daily.   LUMIGAN 0.01 % Soln Generic drug:  bimatoprost Place 1 drop into both eyes daily.   magnesium hydroxide 400 MG/5ML suspension Commonly known as:  MILK OF MAGNESIA Take 30 mLs by mouth every 4 (four) hours as needed for mild constipation. If constipation / no BM for 2 days   Melatonin 3 MG  Tabs Take 1 tablet at bedtime by mouth.   metoprolol tartrate 25 MG tablet Commonly known as:  LOPRESSOR Take 25 mg daily by mouth.   multivitamin with minerals Tabs tablet Take 1 tablet by mouth daily.   pravastatin 10 MG tablet Commonly known as:  PRAVACHOL Take 10 mg by mouth once a week. On Sunday   traZODone 50 MG tablet Commonly known as:  DESYREL Take 50 mg at bedtime by mouth.       Review of Systems  Constitutional: Negative for activity change, appetite change, chills, diaphoresis and fever.  HENT: Negative for congestion, mouth sores, nosebleeds, postnasal drip, sneezing, sore throat, trouble swallowing and voice change.   Respiratory: Negative for apnea, cough, choking, chest tightness, shortness of breath and wheezing.   Cardiovascular: Negative for chest pain, palpitations and leg swelling.  Gastrointestinal: Negative for abdominal distention, abdominal pain, constipation, diarrhea and nausea.  Genitourinary: Negative for difficulty urinating, dysuria, frequency and urgency.  Musculoskeletal: Positive for arthralgias (typical arthritis). Negative for back pain, gait problem and myalgias.  Skin: Negative for color change, pallor, rash and wound.  Neurological: Positive for weakness. Negative for dizziness, tremors, syncope, speech difficulty, numbness and headaches.  Psychiatric/Behavioral: Negative for agitation and behavioral problems.  All other systems reviewed and are negative.   Immunization History  Administered Date(s) Administered  . Influenza-Unspecified 05/16/2017   Pertinent  Health Maintenance Due  Topic Date Due  . FOOT EXAM  04/17/1932  . OPHTHALMOLOGY EXAM  04/17/1932  . URINE MICROALBUMIN  04/17/1932  . PNA vac Low Risk Adult (1 of 2 - PCV13) 04/17/1989  . HEMOGLOBIN A1C  02/27/2018  . INFLUENZA VACCINE  Completed   No flowsheet data found. Functional Status Survey:    Vitals:   10/17/17 1417  BP: 122/66  Pulse: 72  Resp: 16   Temp: 98.2 F (36.8 C)  TempSrc: Oral  SpO2: 93%  Weight: 206 lb 12.8 oz (93.8 kg)  Height: 6\' 2"  (1.88 m)   Body mass index is 26.55 kg/m. Physical Exam  Constitutional: He is oriented to person, place, and time. Vital signs are normal. He appears well-developed and well-nourished. He is active and cooperative. He does not appear ill. No distress.  HENT:  Head: Normocephalic and atraumatic.  Mouth/Throat: Uvula is midline, oropharynx is clear and moist and mucous membranes are normal. Mucous membranes are not pale, not dry and not cyanotic.  Eyes: Pupils are equal, round, and reactive to light. Conjunctivae, EOM and lids are normal.  Neck: Trachea normal, normal range of motion and full passive range of motion without pain. Neck supple. No JVD present. No tracheal deviation, no edema and no erythema present. No thyromegaly present.  Cardiovascular: Normal rate, regular  rhythm, normal heart sounds, intact distal pulses and normal pulses. Exam reveals no gallop, no distant heart sounds and no friction rub.  No murmur heard. Pulses:      Dorsalis pedis pulses are 2+ on the right side, and 2+ on the left side.  No edema  Pulmonary/Chest: Effort normal and breath sounds normal. No accessory muscle usage. No respiratory distress. He has no decreased breath sounds. He has no wheezes. He has no rhonchi. He has no rales. He exhibits no tenderness.  Abdominal: Soft. Normal appearance and bowel sounds are normal. He exhibits no distension and no ascites. There is no tenderness.  Musculoskeletal: Normal range of motion. He exhibits no edema or tenderness.  Expected osteoarthritis, stiffness; Bilateral Calves soft, supple. Negative Homan's Sign. B- pedal pulses equal; generalized weakness  Neurological: He is alert and oriented to person, place, and time. He has normal strength. Coordination and gait abnormal.  Skin: Skin is warm, dry and intact. He is not diaphoretic. No cyanosis. No pallor. Nails  show no clubbing.  Psychiatric: He has a normal mood and affect. His speech is normal. Judgment and thought content normal. He is withdrawn. Cognition and memory are normal.  Patient is pleasant but stays "to himself" in his room most of the time  Nursing note and vitals reviewed.   Labs reviewed: Recent Labs    06/26/17 0154 08/30/17 0500 10/27/2017 1026  NA 140 141 138  K 3.8 4.4 4.4  CL 109 102 104  CO2 24 30 20*  GLUCOSE 152* 96 250*  BUN 25* 23* 35*  CREATININE 1.31* 1.15 1.58*  CALCIUM 8.6* 9.2 9.1  MG  --  1.8  --    Recent Labs    06/24/17 0349 08/30/17 0500 11/01/2017 1026  AST 42* 18 46*  ALT 23 10* 17  ALKPHOS 39 65 81  BILITOT 1.1 0.6 0.9  PROT 5.8* 6.1* 6.9  ALBUMIN 2.9* 2.9* 2.9*   Recent Labs    06/26/17 0154 08/30/17 0500 10/31/2017 1026  WBC 8.6 6.6 14.7*  NEUTROABS 6.1 3.8 13.4*  HGB 13.3 12.5* 12.9*  HCT 40.0 37.8* 40.3  MCV 88.8 83.7 84.2  PLT 157 222 245   Lab Results  Component Value Date   TSH 3.860 08/30/2017   Lab Results  Component Value Date   HGBA1C 7.5 (H) 08/30/2017   Lab Results  Component Value Date   CHOL 117 07/17/2014   HDL 32 (L) 07/17/2014   LDLCALC 65 07/17/2014   TRIG 99 07/17/2014    Significant Diagnostic Results in last 30 days:  Dg Chest Port 1 View  Result Date: 11/08/2017 CLINICAL DATA:  Fever.  Weakness.  Combative.  Shortness of breath. EXAM: PORTABLE CHEST 1 VIEW COMPARISON:  06/26/2017. FINDINGS: Mediastinum and hilar structures normal. Cardiomegaly with normal pulmonary vascularity. Low lung volumes with mild basilar atelectasis. No pleural effusion or pneumothorax. IMPRESSION: Low lung volumes with mild basilar atelectasis. Exam otherwise unremarkable. Electronically Signed   By: Maisie Fus  Register   On: 11/16/2017 11:07    Assessment/Plan Jerron was seen today for medical management of chronic issues.  Diagnoses and all orders for this visit:  Type 2 diabetes mellitus with chronic kidney disease,  without long-term current use of insulin, unspecified CKD stage (HCC)  CKD (chronic kidney disease), stage III (HCC)  Benign prostatic hyperplasia without lower urinary tract symptoms   Above listed conditions stable  Continue current medication regimen  Continue daily and as needed blood sugar checks  Continue to  renally adjust medications as appropriate  Monitor patient for urinary retention, frequency, urgency  Monitor for potential episodes of hyper or hypoglycemia  Encourage p.o. fluid intake  Family/ staff Communication:  Total Time:  Documentation:  Face to Face:  Family/Phone:   Labs/tests ordered: Not due  Medication list reviewed and assessed for continued appropriateness. Monthly medication orders reviewed and signed.  Brynda RimShannon H. Kekai Geter, NP-C [facility] [phone] [address] [place], [place] [address] Cell Phone (Mon-Fri 8am-5pm):  [phone] On Call:  [phone] & follow prompts after 5pm & weekends Office Phone:  [phone] Office Fax:  [phone]

## 2017-11-20 NOTE — ED Notes (Signed)
Date and time results received: 02-22-18 11:06 AM  Test: lactic acid, troponin I Critical Value: 6.1, 0.05 respectively   Name of Provider Notified: dr. Mayford Knifewilliams  Orders Received? Or Actions Taken?: no new orders at this time

## 2017-11-20 NOTE — H&P (Signed)
[facility] [place] [facility]   PATIENT NAME: Patrick Chan    MR#:  [id number]  DATE OF BIRTH:  [birth year]  DATE OF ADMISSION:  22-Sep-2017  PRIMARY CARE PHYSICIAN: Marguarite ArbourSparks, Jeffrey D, MD   REQUESTING/REFERRING PHYSICIAN: Dr. Daryel NovemberJonathan williams CHIEF COMPLAINT: Altered mental status   Chief Complaint  Patient presents with  . Bradycardia    HISTORY OF PRESENT ILLNESS:  Patrick Chan  is a 84 y.o. male with a known history of seizure disorder, hypertension, paroxysmal atrial fibrillation [facility] because of fever, lethargy.  According to family patient noted to have shaking with chills yesterday and today morning noticed fever, altered mental status, bradycardia, heart rate found to be 29 at one point as per chart.  Patient lethargic, history obtained from patient sons and also from ER charts. Temperature 103.1 as per EMS, started on BiPAP upon arrival because of tachypnea, respiratory distress.  And is off the BiPAP but on nonrebreather with 15 L of oxygen.  Hypotensive with blood pressure up to 95/52, patient received about 3 L of normal saline but blood pressure is still hovering between 100/46, 90/60.  PAST MEDICAL HISTORY:   Past Medical History:  Diagnosis Date  . Arthritis   . BPH (benign prostatic hyperplasia)   . Chicken pox   . DDD (degenerative disc disease), lumbosacral    LS spine with HNP S1 by MRI 8/08  . Depression   . Diabetes mellitus type 2, uncomplicated (HCC)   . Diabetes mellitus without complication (HCC)   . History of colonic polyps   . Hypertension   . Nephrolithiasis   . Seizures (HCC)   . Sleep apnea     PAST SURGICAL HISTOIRY:   Past Surgical History:  Procedure Laterality Date  . CATARACT EXTRACTION, BILATERAL    . COLON SURGERY  01/2002   colonoscopy with polypectomy  . JOINT REPLACEMENT    . ORIF HIP FRACTURE Right 12/2011  . PROSTATE SURGERY    . TONSILLECTOMY       SOCIAL HISTORY:   Social History   Tobacco Use  . Smoking status: Former Games developermoker  . Smokeless tobacco: Never Used  Substance Use Topics  . Alcohol use: No    FAMILY HISTORY:   Family History  Problem Relation Age of Onset  . Stroke Mother   . Heart attack Mother   . Heart attack Father     DRUG ALLERGIES:  No Known Allergies  REVIEW OF SYSTEMS:  Patient lethargic, unable to obtain review of systems because of lethargy.  MEDICATIONS AT HOME:   Prior to Admission medications   Medication Sig Start Date End Date Taking? Authorizing Provider  acetaminophen (TYLENOL) 650 MG suppository Place 650 mg rectally every 4 (four) hours as needed for fever. Give 1 suppository Q 4 hours for fever if unable to take med PO. Maximum dose for 24 hours is 3,000 mg for all sources of Acetaminophen/ Tylenol   Yes [provider]  Amino Acids-Protein Hydrolys (FEEDING SUPPLEMENT, PRO-STAT SUGAR FREE 64,) LIQD Take 30 mLs 2 (two) times daily between meals by mouth.   Yes [provider]  apixaban (ELIQUIS) 2.5 MG TABS tablet Take 2.5 mg by mouth 2 (two) times daily.    Yes [provider]  citalopram (CELEXA) 20 MG tablet Take 20 mg daily by mouth.    Yes [provider]  docusate sodium (COLACE) 100 MG capsule Take 100 mg by mouth 2 (two)  times daily.    Yes [provider]  finasteride (PROSCAR) 5 MG tablet Take 5 mg daily by mouth.    Yes [provider]  glimepiride (AMARYL) 2 MG tablet Take 2 mg daily with breakfast by mouth.    Yes [provider]  Infant Care Products (DERMACLOUD) CREA Apply ointment topically once daily   Yes [provider]  levETIRAcetam (KEPPRA) 750 MG tablet Take 750 mg by mouth 2 (two) times daily.    Yes [provider]  LUMIGAN 0.01 % SOLN Place 1 drop into both eyes daily.  05/14/17  Yes [provider]  magnesium hydroxide (MILK OF MAGNESIA) 400 MG/5ML suspension Take 30 mLs  by mouth every 4 (four) hours as needed for mild constipation. If constipation / no BM for 2 days   Yes [provider]  Melatonin 3 MG TABS Take 1 tablet at bedtime by mouth.   Yes [provider]  metoprolol tartrate (LOPRESSOR) 25 MG tablet Take 25 mg daily by mouth.   Yes [provider]  Multiple Vitamin (MULTIVITAMIN WITH MINERALS) TABS tablet Take 1 tablet by mouth daily.   Yes [provider]  pravastatin (PRAVACHOL) 10 MG tablet Take 10 mg by mouth once a week. On Sunday   Yes [provider]  traZODone (DESYREL) 50 MG tablet Take 50 mg at bedtime by mouth.    Yes [provider]      VITAL SIGNS:  Blood pressure (!) 100/46, pulse (!) 36, temperature (!) 100.7 F (38.2 C), resp. rate (!) 34, height 6' (1.829 m), weight 95.3 kg (210 lb), SpO2 100 %.  PHYSICAL EXAMINATION:  GENERAL:  84 y.o.-year-old patient lying in the bed, looks critically ill, responds to calling his name by opening eyes.  EYES: Pupils equal, round, reactive to light   No scleral icterus.  HEENT: Head atraumatic, normocephalic. Oropharynx and nasopharynx clear.  NEC' no jugular venous distention. No thyroid enlargement, no tenderness.  LUNGS: Breath sounds bilaterally at bases but no rales or wheeze.Marland Kitchen  CARDIOVASCULAR: S1, S2 normal. No murmurs, rubs, or gallops.  ABDOMEN: Soft, r, nondistended. Bowel sounds present. No organomegaly or mass.  EXTREMITIES: No pedal edema, cyanosis, or clubbing.  NEUROLOGIC; she is lethargic, unable to follow direction for full neurological exam.  PSYCHIATRIC: The patient is lethargic. SKIN: No obvious rash, lesion, or ulcer.   LABORATORY PANEL:   CBC Recent Labs  Lab 2017-12-01 1026  WBC 14.7*  HGB 12.9*  HCT 40.3  PLT 245   ------------------------------------------------------------------------------------------------------------------  Chemistries  Recent Labs  Lab 12/01/2017 1026  NA 138  K 4.4  CL 104  CO2  20*  GLUCOSE 250*  BUN 35*  CREATININE 1.58*  CALCIUM 9.1  AST 46*  ALT 17  ALKPHOS 81  BILITOT 0.9   ------------------------------------------------------------------------------------------------------------------  Cardiac Enzymes Recent Labs  Lab 12-01-17 1026  TROPONINI 0.05*   ------------------------------------------------------------------------------------------------------------------  RADIOLOGY:  Dg Chest Port 1 View  Result Date: 2017-12-01 CLINICAL DATA:  Fever.  Weakness.  Combative.  Shortness of breath. EXAM: PORTABLE CHEST 1 VIEW COMPARISON:  06/26/2017. FINDINGS: Mediastinum and hilar structures normal. Cardiomegaly with normal pulmonary vascularity. Low lung volumes with mild basilar atelectasis. No pleural effusion or pneumothorax. IMPRESSION: Low lung volumes with mild basilar atelectasis. Exam otherwise unremarkable. Electronically Signed   By: Maisie Fus  Register   On: Dec 01, 2017 11:07    EKG:   Orders placed or performed during the hospital encounter of 12-01-2017  . ED EKG  . ED  EKG  . EKG 12-Lead  . EKG 12-Lead  EKG shows sinus rhythm with 82 bpm with prolonged PR interval, prolonged QT  IMPRESSION AND PLAN:   84 year old male patient with multiple medical problems of seizure disorder, paroxysmal atrial fibrillation, hypertension, diabetes mellitus type 2, chronic kidney disease stage II comes in with altered mental status, fever, chills, bradycardia.  Patient comes from nursing home. 1.  Septic shock secondary to UTI:-Bibasilar pneumonia patient received 3 L of normal saline, BP still hovering in the low 90s-100s, continue aggressive hydration, lactic acid up to 6.  Patient admitted to stepdown unit secondary to septic shock.  Patient had history of E. coli UTI before, started on Zosyn, follow urine cultures, blood cultures./Bibasilar pneumonia 2.  Septic shock causing respiratory distress, tachypnea patient started on BiPAP in the ER and now on  nonrebreather.  Spoke to family especially 2 sons who are at bedside now, they understand that he is a critical and wanted to make him DNR. 3.  #3 transient bradycardia resolved now hold beta-blockers because of septic shock. 4.  History of proximal atrial fibrillation: Patient takes Eliquis at nursing home but because of lethargy keep him n.p.o., continue Lovenox. #5 metabolic encephalopathy secondary to septic shock: Continue n.p.o., continue aggressive hydration 6.  History of seizure disorder: Continue Keppra IV. 6.  History of diabetes mellitus type 2: Patient is n.p.o. now, continue sliding scale insulin with coverage. 7.  Acute on chronic renal failure, CKD stage II: Continue IV fluids, avoid nephrotoxic agents. 8. history of protein calorie malnutrition.  #9/ history of BPH: Patient received Foley catheter with no urine output.  Continue Foley, monitor urine output, resume his Proscar to take p.o. All the records are reviewed and case discussed with ED provider. Management plans discussed with the patient, family and they are in agreement.  CODE STATUS: DNR  TOTAL TIME TAKING CARE OF THIS PATIENT:9minutes.    Katha Hamming M.D on 11/23/2017 at 12:19 PM  Between 7am to 6pm - Pager - [phone]  After 6pm go to [email or web address] - password [email or web address] [facility]  [facility]  Office  [phone]  CC: Primary care physician; Marguarite Arbour, MD  Note: This dictation was prepared with Dragon dictation along with smaller phrase technology. Any transcriptional errors that result from this process are unintentional.

## 2017-11-20 NOTE — Assessment & Plan Note (Signed)
Stable.  No current or recent issues with voiding.  No reports of retention, urgency or frequency.  Symptoms controlled with Proscar 5 mg p.o. daily

## 2017-11-20 NOTE — ED Notes (Signed)
Patient' belongings given to his son  Patrick CoombesGreg  Lunsforda sliver watch with a yellow gold stripe, and one sliver looking  Earring adie

## 2017-11-20 NOTE — Progress Notes (Signed)
Pharmacy Antibiotic Note  Patrick Chan is a 84 y.o. male admitted on 11/13/2017 with sepsis.  Pharmacy has been consulted for Zosyn and vancomycin dosing.  Plan: Received one dose of Zosyn and vancomycin in the ED.  Upon admission, will start Zosyn 3.375g IV q8h.  Will also start vancomycin 1000mg  IV q24h with stack dose in 6 hours.   Goal trough 15-20 and will check first trough before 4th dose.  Cmin 16, Ke 0.003, Vd 54.3, t1/2 21  Height: 6' (182.9 cm) Weight: 210 lb (95.3 kg) IBW/kg (Calculated) : 77.6  Temp (24hrs), Avg:102.4 F (39.1 C), Min:100.9 F (38.3 C), Max:103.4 F (39.7 C)  Recent Labs  Lab 11/11/2017 1025 10/27/2017 1026  WBC  --  14.7*  CREATININE  --  1.58*  LATICACIDVEN 6.1*  --     Estimated Creatinine Clearance: 35 mL/min (A) (by C-G formula based on SCr of 1.58 mg/dL (H)).    No Known Allergies  Antimicrobials this admission: 3/26 Zosyn>>  3/26 Vancomycin >>   Dose adjustments this admission:   Microbiology results: 3/26 BCx: Sent 3/26 UCx: Sent   Thank you for allowing pharmacy to be a part of this patient's care.  Cleopatra CedarStephanie Shelie Lansing, PharmD Pharmacy Resident  11/13/2017 12:08 PM

## 2017-11-20 NOTE — ED Notes (Signed)
Pt taken off BiPAP and placed on 15L via NRB per EDP instructions. O2 sat 99%. [id number].

## 2017-11-20 NOTE — ED Notes (Signed)
Temperature Foley placed, very small amount of thick, pink-tinged purulent discharge noted in catheter. Unable to collect urine specimen prior to ABT starting d/t insufficient quantity from catheter at this time. IVF running wide open via PIV.

## 2017-11-20 NOTE — Progress Notes (Addendum)
Family Meeting Note  Advance Directive; yes  Today a meeting took place with the Patient's sons.  Patient is unable to participate due ZO:XWRUEAto:Lacked capacity lethagy, dementia   The following clinical team members were present during this meeting:MD Spoke to patient's son at bedside extensively, multiple medical problems, advanced age, dementia, now with septic shock, patient also having respiratory distress they understand that he is critical and wanted to make him DNR and DNI. The following were discussed:Patient's diagnosis: , Patient's progosis:poor   Additional follow-up to be provided: Recommend palliative care consult   Time spent during discussion:20 minutes  Katha HammingSnehalatha Othar Curto, MD

## 2017-11-20 NOTE — Assessment & Plan Note (Signed)
Stable.  Blood sugars controlled on glimepiride 2 mg p.o. once daily.  Daily blood sugar checks at 6 AM and as needed.  Typical blood sugars range between 81 and 150, though typically less than 120.  Patient is on a carb controlled diet and does not typically eat outside foods.  A1c check in January was 7.3.  No complications noted

## 2017-11-20 NOTE — ED Provider Notes (Signed)
[facility] Emergency Department Provider Note       Time seen: ----------------------------------------- 10:28 AM on 10/30/2017 ----------------------------------------- Level V caveat: History/ROS limited by altered mental status  I have reviewed the triage vital signs and the nursing notes.  HISTORY   Chief Complaint Bradycardia    HPI Patrick Chan is a 84 y.o. male with a history of arthritis, depression, diabetes, hypertension, seizures and sleep apnea who presents to the ED for possible sepsis.  Patient was noted to be febrile.  He is coming from a nursing home and was noted to be febrile with low heart rate and altered mental status.  EMS began pacing the patient prior to arrival with good capture.  Heart rate was reportedly around 29 at one point.  Reportedly he has a baseline history of altered mental status.  Past Medical History:  Diagnosis Date  . Arthritis   . BPH (benign prostatic hyperplasia)   . Chicken pox   . DDD (degenerative disc disease), lumbosacral    LS spine with HNP S1 by MRI 8/08  . Depression   . Diabetes mellitus type 2, uncomplicated (HCC)   . Diabetes mellitus without complication (HCC)   . History of colonic polyps   . Hypertension   . Nephrolithiasis   . Seizures (HCC)   . Sleep apnea     Patient Active Problem List   Diagnosis Date Noted  . Protein-calorie malnutrition (HCC) 08/27/2017  . Generalized muscle weakness 08/27/2017  . History of falling 08/27/2017  . Paroxysmal atrial fibrillation (HCC) 08/27/2017  . Convulsion (HCC) 08/27/2017  . Glaucoma 08/27/2017  . Constipation 08/27/2017  . Insomnia 08/27/2017  . Type 2 diabetes mellitus (HCC) 10/10/2015  . Depression 10/10/2015  . BPH (benign prostatic hyperplasia) 10/10/2015  . HTN (hypertension) 10/10/2015  . OSA on CPAP 10/10/2015  . CKD (chronic kidney disease), stage III (HCC) 10/10/2015    Past Surgical History:  Procedure Laterality Date   . CATARACT EXTRACTION, BILATERAL    . COLON SURGERY  01/2002   colonoscopy with polypectomy  . JOINT REPLACEMENT    . ORIF HIP FRACTURE Right 12/2011  . PROSTATE SURGERY    . TONSILLECTOMY      Allergies Patient has no known allergies.  Social History Social History   Tobacco Use  . Smoking status: Former Games developer  . Smokeless tobacco: Never Used  Substance Use Topics  . Alcohol use: No  . Drug use: No   Review of Systems Unknown, reported fever, altered mental status, and low heart rate  All systems otherwise unknown at this point.  ____________________________________________   PHYSICAL EXAM:  VITAL SIGNS: ED Triage Vitals  Enc Vitals Group     BP      Pulse      Resp      Temp      Temp src      SpO2      Weight      Height      Head Circumference      Peak Flow      Pain Score      Pain Loc      Pain Edu?      Excl. in GC?    Constitutional: Alert but disoriented.  Nonverbal, moderate distress Eyes: Conjunctivae are normal. Normal extraocular movements. ENT   Head: Normocephalic and atraumatic.   Nose: No congestion/rhinnorhea.   Mouth/Throat: Mucous membranes are moist.   Neck: No stridor. Cardiovascular: Normal rate, regular rhythm. No  murmurs, rubs, or gallops. Respiratory: Tachypnea with grossly clear breath sounds. Gastrointestinal: Soft and nontender. Normal bowel sounds Musculoskeletal: Limited range of motion, no significant edema Neurologic: Nonverbal, patient localizes to pain Skin:  Skin is warm, dry and intact. No rash noted. Psychiatric: Anxious ____________________________________________  EKG: Interpreted by me.  Sinus rhythm the rate of 82 bpm, prolonged PR interval, prolonged QT  ____________________________________________  ED COURSE:  As part of my medical decision making, I reviewed the following data within the electronic MEDICAL RECORD NUMBER History obtained from family if available, nursing notes, old chart and  ekg, as well as notes from prior ED visits. Patient presented for altered mental status and possible sepsis, we will assess with labs and imaging as indicated at this time.  We will initiate sepsis protocol.   Procedures ____________________________________________   LABS (pertinent positives/negatives)  Labs Reviewed  LACTIC ACID, PLASMA - Abnormal; Notable for the following components:      Result Value   Lactic Acid, Venous 6.1 (*)    All other components within normal limits  COMPREHENSIVE METABOLIC PANEL - Abnormal; Notable for the following components:   CO2 20 (*)    Glucose, Bld 250 (*)    BUN 35 (*)    Creatinine, Ser 1.58 (*)    Albumin 2.9 (*)    AST 46 (*)    GFR calc non Af Amer 36 (*)    GFR calc Af Amer 42 (*)    All other components within normal limits  TROPONIN I - Abnormal; Notable for the following components:   Troponin I 0.05 (*)    All other components within normal limits  CBC WITH DIFFERENTIAL/PLATELET - Abnormal; Notable for the following components:   WBC 14.7 (*)    Hemoglobin 12.9 (*)    RDW 16.7 (*)    Neutro Abs 13.4 (*)    All other components within normal limits  BLOOD GAS, VENOUS - Abnormal; Notable for the following components:   pH, Ven 7.45 (*)    pCO2, Ven 32 (*)    All other components within normal limits  CULTURE, BLOOD (ROUTINE X 2)  CULTURE, BLOOD (ROUTINE X 2)  URINE CULTURE  LACTIC ACID, PLASMA  URINALYSIS, COMPLETE (UACMP) WITH MICROSCOPIC   CRITICAL CARE Performed by: Ulice DashJohnathan E Vanesha Athens   Total critical care time: 30 minutes  Critical care time was exclusive of separately billable procedures and treating other patients.  Critical care was necessary to treat or prevent imminent or life-threatening deterioration.  Critical care was time spent personally by me on the following activities: development of treatment plan with patient and/or surrogate as well as nursing, discussions with consultants, evaluation of  patient's response to treatment, examination of patient, obtaining history from patient or surrogate, ordering and performing treatments and interventions, ordering and review of laboratory studies, ordering and review of radiographic studies, pulse oximetry and re-evaluation of patient's condition.  RADIOLOGY Images were viewed by me  Chest x-ray IMPRESSION: Low lung volumes with mild basilar atelectasis. Exam otherwise unremarkable. ____________________________________________  DIFFERENTIAL DIAGNOSIS   Sepsis, dehydration, electrolyte abnormality, MI, CVA  FINAL ASSESSMENT AND PLAN  Altered mental status, UTI, sepsis   Plan: The patient had presented for altered mental status, tachypnea, fever and possible bradycardia. Patient's labs revealed leukocytosis and marketed lactic acidosis suggesting sepsis likely from UTI.  He has had little urine output at this point and is receiving a 30 cc/kg IV fluid bolus.  Patient's imaging did not reveal any acute process.  We have started broad-spectrum antibiotics and he will likely need ICU stepdown admission.   Ulice Dash, MD   Note: This note was generated in part or whole with voice recognition software. Voice recognition is usually quite accurate but there are transcription errors that can and very often do occur. I apologize for any typographical errors that were not detected and corrected.     Emily Filbert, MD 12-18-2017 1134

## 2017-11-21 ENCOUNTER — Inpatient Hospital Stay: Payer: Medicare Other

## 2017-11-21 DIAGNOSIS — Z7189 Other specified counseling: Secondary | ICD-10-CM

## 2017-11-21 DIAGNOSIS — R7881 Bacteremia: Secondary | ICD-10-CM

## 2017-11-21 DIAGNOSIS — Z515 Encounter for palliative care: Secondary | ICD-10-CM

## 2017-11-21 DIAGNOSIS — R6521 Severe sepsis with septic shock: Secondary | ICD-10-CM

## 2017-11-21 DIAGNOSIS — A419 Sepsis, unspecified organism: Secondary | ICD-10-CM

## 2017-11-21 LAB — GLUCOSE, CAPILLARY
GLUCOSE-CAPILLARY: 149 mg/dL — AB (ref 65–99)
GLUCOSE-CAPILLARY: 133 mg/dL — AB (ref 65–99)
Glucose-Capillary: 132 mg/dL — ABNORMAL HIGH (ref 65–99)
Glucose-Capillary: 150 mg/dL — ABNORMAL HIGH (ref 65–99)

## 2017-11-21 LAB — CBC
HCT: 39 % — ABNORMAL LOW (ref 40.0–52.0)
Hemoglobin: 12.5 g/dL — ABNORMAL LOW (ref 13.0–18.0)
MCH: 27.1 pg (ref 26.0–34.0)
MCHC: 32.1 g/dL (ref 32.0–36.0)
MCV: 84.4 fL (ref 80.0–100.0)
Platelets: 198 10*3/uL (ref 150–440)
RBC: 4.62 MIL/uL (ref 4.40–5.90)
RDW: 16.6 % — AB (ref 11.5–14.5)
WBC: 11.2 10*3/uL — AB (ref 3.8–10.6)

## 2017-11-21 LAB — BLOOD CULTURE ID PANEL (REFLEXED)
ACINETOBACTER BAUMANNII: NOT DETECTED
CANDIDA ALBICANS: NOT DETECTED
CANDIDA GLABRATA: NOT DETECTED
CANDIDA PARAPSILOSIS: NOT DETECTED
CANDIDA TROPICALIS: NOT DETECTED
Candida krusei: NOT DETECTED
Carbapenem resistance: NOT DETECTED
ESCHERICHIA COLI: DETECTED — AB
Enterobacter cloacae complex: NOT DETECTED
Enterobacteriaceae species: DETECTED — AB
Enterococcus species: NOT DETECTED
Haemophilus influenzae: NOT DETECTED
KLEBSIELLA PNEUMONIAE: NOT DETECTED
Klebsiella oxytoca: NOT DETECTED
Listeria monocytogenes: NOT DETECTED
Neisseria meningitidis: NOT DETECTED
PROTEUS SPECIES: NOT DETECTED
PSEUDOMONAS AERUGINOSA: NOT DETECTED
STREPTOCOCCUS PYOGENES: NOT DETECTED
Serratia marcescens: NOT DETECTED
Staphylococcus aureus (BCID): NOT DETECTED
Staphylococcus species: NOT DETECTED
Streptococcus agalactiae: NOT DETECTED
Streptococcus pneumoniae: NOT DETECTED
Streptococcus species: NOT DETECTED

## 2017-11-21 LAB — BLOOD GAS, ARTERIAL
ACID-BASE DEFICIT: 0.6 mmol/L (ref 0.0–2.0)
BICARBONATE: 26.6 mmol/L (ref 20.0–28.0)
FIO2: 0.32
O2 SAT: 94.2 %
PATIENT TEMPERATURE: 36.8
PCO2 ART: 54 mmHg — AB (ref 32.0–48.0)
PO2 ART: 78 mmHg — AB (ref 83.0–108.0)
pH, Arterial: 7.3 — ABNORMAL LOW (ref 7.350–7.450)

## 2017-11-21 LAB — BASIC METABOLIC PANEL
ANION GAP: 8 (ref 5–15)
BUN: 25 mg/dL — AB (ref 6–20)
CALCIUM: 8.3 mg/dL — AB (ref 8.9–10.3)
CO2: 24 mmol/L (ref 22–32)
Chloride: 110 mmol/L (ref 101–111)
Creatinine, Ser: 1.13 mg/dL (ref 0.61–1.24)
GFR calc Af Amer: >60 mL/min (ref >60)
GFR, EST NON AFRICAN AMERICAN: 54 mL/min — AB (ref >60)
GLUCOSE: 157 mg/dL — AB (ref 65–99)
POTASSIUM: 3.7 mmol/L (ref 3.5–5.1)
Sodium: 142 mmol/L (ref 135–145)

## 2017-11-21 LAB — MAGNESIUM: MAGNESIUM: 2.1 mg/dL (ref 1.7–2.4)

## 2017-11-21 LAB — PHOSPHORUS: PHOSPHORUS: 2.9 mg/dL (ref 2.5–4.6)

## 2017-11-21 LAB — LACTIC ACID, PLASMA: Lactic Acid, Venous: 1.2 mmol/L (ref 0.5–1.9)

## 2017-11-21 MED ORDER — MORPHINE SULFATE (PF) 2 MG/ML IV SOLN: 2.0000 mg | INTRAVENOUS | Status: DC | PRN

## 2017-11-21 MED ORDER — FUROSEMIDE 10 MG/ML IJ SOLN
INTRAMUSCULAR | Status: AC
  Administered 2017-11-21: 20 mg via INTRAVENOUS
  Filled 2017-11-21: qty 2

## 2017-11-21 MED ORDER — LORAZEPAM 2 MG/ML IJ SOLN
1.0000 mg | INTRAMUSCULAR | Status: AC
  Administered 2017-11-21: 1 mg via INTRAVENOUS
  Filled 2017-11-21: qty 1

## 2017-11-21 MED ORDER — MORPHINE SULFATE (PF) 2 MG/ML IV SOLN
INTRAVENOUS | Status: AC
  Administered 2017-11-21: 2 mg via INTRAMUSCULAR
  Filled 2017-11-21: qty 1

## 2017-11-21 MED ORDER — LORAZEPAM 2 MG/ML IJ SOLN
1.0000 mg | Freq: Once | INTRAMUSCULAR | Status: AC
  Administered 2017-11-21: 19:00:00 1 mg via INTRAVENOUS
  Filled 2017-11-21: qty 1

## 2017-11-21 MED ORDER — FUROSEMIDE 10 MG/ML IJ SOLN
20.0000 mg | Freq: Once | INTRAMUSCULAR | Status: AC
  Administered 2017-11-21: 20 mg via INTRAVENOUS

## 2017-11-21 MED ORDER — SODIUM CHLORIDE 0.9 % IV SOLN
1.0000 g | Freq: Two times a day (BID) | INTRAVENOUS | Status: DC
  Administered 2017-11-21 (×3): 1 g via INTRAVENOUS
  Filled 2017-11-21 (×5): qty 1

## 2017-11-21 NOTE — Progress Notes (Signed)
[facility] [place] [facility]   PATIENT NAME: Patrick Chan    MR#:  [id number]  DATE OF BIRTH:  1922-08-06  SUBJECTIVE:  CHIEF COMPLAINT:   Chief Complaint  Patient presents with  . Bradycardia  confused, agitated, son at bedside REVIEW OF SYSTEMS:  Review of Systems  Unable to perform ROS: Mental acuity   DRUG ALLERGIES:  No Known Allergies VITALS:  Blood pressure 136/77, pulse (!) 121, temperature (!) 103 F (39.4 C), temperature source Oral, resp. rate (!) 32, height 6\' 1"  (1.854 m), weight 92 kg (202 lb 13.2 oz), SpO2 93 %. PHYSICAL EXAMINATION:  Physical Exam  Constitutional: He is well-developed, well-nourished, and in no distress. He appears unhealthy. He appears toxic. He has a sickly appearance.  HENT:  Head: Normocephalic and atraumatic.  Eyes: Pupils are equal, round, and reactive to light. Conjunctivae and EOM are normal.  Neck: Normal range of motion. Neck supple. No tracheal deviation present. No thyromegaly present.  Cardiovascular: Normal rate, regular rhythm and normal heart sounds.  Pulmonary/Chest: Accessory muscle usage present. Tachypnea noted. No respiratory distress. He has decreased breath sounds. He has no wheezes. He exhibits no tenderness.  Abdominal: Soft. Bowel sounds are normal. He exhibits no distension. There is no tenderness.  Musculoskeletal: Normal range of motion.  Neurological: He is alert. He is agitated and disoriented. No cranial nerve deficit.  Skin: Skin is warm and dry. No rash noted.  Psychiatric: His mood appears anxious. He is agitated. He exhibits disordered thought content.   LABORATORY PANEL:  Male CBC Recent Labs  Lab 11/21/17 0551  WBC 11.2*  HGB 12.5*  HCT 39.0*  PLT 198   ------------------------------------------------------------------------------------------------------------------ Chemistries  Recent Labs  Lab 11/11/2017 1026 11/21/17 0551  NA 138 142  K 4.4 3.7  CL 104 110    CO2 20* 24  GLUCOSE 250* 157*  BUN 35* 25*  CREATININE 1.58* 1.13  CALCIUM 9.1 8.3*  MG  --  2.1  AST 46*  --   ALT 17  --   ALKPHOS 81  --   BILITOT 0.9  --    RADIOLOGY:  Dg Chest Port 1 View  Result Date: 11/21/2017 CLINICAL DATA:  Respiratory failure EXAM: PORTABLE CHEST 1 VIEW COMPARISON:  November 20, 2017 FINDINGS: There is atelectatic change in the left base. Lungs elsewhere clear. Heart is upper normal in size with pulmonary vascularity within normal limits. No adenopathy. There is aortic atherosclerosis. No bone lesions. IMPRESSION: Left base atelectasis. Lungs elsewhere clear. Stable cardiac silhouette. There is aortic atherosclerosis. Aortic Atherosclerosis (ICD10-I70.0). Electronically Signed   By: Bretta Bang III M.D.   On: 11/21/2017 07:34   ASSESSMENT AND PLAN:  84 year old male patient with multiple medical problems of seizure disorder, paroxysmal atrial fibrillation, hypertension, diabetes mellitus type 2, chronic kidney disease stage II comes in with altered mental status, fever, chills, bradycardia.  Patient comes from nursing home.  * Septic shock secondary to UTI:  * Acute metabolic encephalopathy secondary to severe sepsis   * GNR UTI & bacteremia- ID and sensitivity pending - Will c/s ID  * Transient bradycardia from sepsis- has resolved  * Acute Kidney Injury from sepsis/UTI - now resolved  * Hx of Seizure disorder: on Keppra IV  * Confusion- avoid benzodiazepine  * History of proximal atrial fibrillation: Patient takes Eliquis at nursing home but because of lethargy keep him n.p.o., continue Lovenox. Await ST eval for swallow function  * History of diabetes mellitus type  2: sliding scale insulin with coverage.  * Severe protein calorie malnutrition.  * history of BPH: has Foley, monitor urine output, resume his Proscar to take p.o.    Floor transfer per ICU team - I would prefer for him to be observed in ICU as still febrile, tachypneic,  tachycardic, confused - seems septic   All the records are reviewed and case discussed with Care Management/Social Worker. Management plans discussed with the patient, family (son at bedside) and they are in agreement.  CODE STATUS: DNR  TOTAL TIME TAKING CARE OF THIS PATIENT: 15 minutes.   More than 50% of the time was spent in counseling/coordination of care: YES  POSSIBLE D/C IN 1-2 DAYS, DEPENDING ON CLINICAL CONDITION.   Delfino LovettVipul Deundra Bard M.D on 11/21/2017 at 8:12 PM  Between 7am to 6pm - Pager - [phone]  After 6pm go to [email or web address] - Social research officer, governmentpassword [email or web address]  [facility]  Office  [phone]  CC: Primary care physician; Marguarite ArbourSparks, Jeffrey D, MD  Note: This dictation was prepared with Dragon dictation along with smaller phrase technology. Any transcriptional errors that result from this process are unintentional.

## 2017-11-21 NOTE — Progress Notes (Signed)
   11/21/17 2345  Clinical Encounter Type  Visited With Health care provider  Visit Type Follow-up   Chaplain followed up with patient's nurse.  Nurse spoke about patient's current condition and stated that family had not yet responded to message left.  Chaplain encouraged nurse to page as needed.

## 2017-11-21 NOTE — Consult Note (Signed)
[facility] Pulmonary Medicine Consultation      Name: LASARO PRIMM MRN: [id number] DOB: February 03, 1922    ADMISSION DATE:  10/31/2017 CONSULTATION DATE:  10/26/2017  REFERRING MD :  Katha Hamming, MD   CHIEF COMPLAINT:     Bradycardia   HISTORY OF PRESENT ILLNESS    Jermarion Poffenberger  is a 84 y.o. male with a known history of seizure disorder, hypertension, paroxysmal atrial fibrillation who resides at [facility] skilled nursing presented to the Ed with  fever, lethargy.  According to family patient noted to have shaking with chills yesterday and this AM he had a fever, altered mental status and bradycardia with heart rate of 29. Hence he was brought to the ED for evaluation. In the ED he was found to be pyrexial with a temperature of 103.1. He wash hypotensive with blood pressure up to 95/52. He was placed on 100% NRM oxygen and given 3 liters of IVF. He was cultured, started on antibiotics and transferred tot he ICU for further management. The patient's son was available and changed the code status to DNR.    SIGNIFICANT EVENTS    Bradycardia    ROS confused   VITAL SIGNS    Temp:  [97.5 F (36.4 C)-101.3 F (38.5 C)] 98.3 F (36.8 C) (03/27 1200) Pulse Rate:  [53-119] 82 (03/27 1300) Resp:  [16-33] 20 (03/27 1400) BP: (79-160)/(44-92) 160/85 (03/27 1400) SpO2:  [82 %-100 %] 84 % (03/27 1300) Weight:  [202 lb 13.2 oz (92 kg)] 202 lb 13.2 oz (92 kg) (03/27 0409) HEMODYNAMICS:  none  OXYGEN: RA   INTAKE / OUTPUT:  Intake/Output Summary (Last 24 hours) at 11/21/2017 1459 Last data filed at 11/21/2017 1400 Gross per 24 hour  Intake 4345.68 ml  Output 2795 ml  Net 1550.68 ml       PHYSICAL EXAM    GENERAL: much more awake, confused EYES: Pupils equal, round, reactive to light   No scleral icterus.  HEENT: Head atraumatic, normocephalic. Oropharynx and nasopharynx clear but dry.  NEC' no jugular venous distention. No thyroid  enlargement, no tenderness.  LUNGS: Good air entry bilateral apex;  but no rales or wheeze.Marland Kitchen  CARDIOVASCULAR: S1, S2 normal. No murmurs, rubs, or gallops.  ABDOMEN: Soft, r, nondistended. Bowel sounds present. No organomegaly or mass.  EXTREMITIES: No pedal edema, cyanosis, or clubbing.  NEUROLOGIC: lethargic, unable to follow direction for full neurological exam. SKIN: No obvious rash, lesion, or ulcer, warm and dry.       LABS   LABS:  CBC Recent Labs  Lab 11/06/2017 1026 11/21/17 0551  WBC 14.7* 11.2*  HGB 12.9* 12.5*  HCT 40.3 39.0*  PLT 245 198   Coag's No results for input(s): APTT, INR in the last 168 hours. BMET Recent Labs  Lab 11/25/2017 1026 11/21/17 0551  NA 138 142  K 4.4 3.7  CL 104 110  CO2 20* 24  BUN 35* 25*  CREATININE 1.58* 1.13  GLUCOSE 250* 157*   Electrolytes Recent Labs  Lab 11/02/2017 1026 11/21/17 0551  CALCIUM 9.1 8.3*  MG  --  2.1  PHOS  --  2.9   Sepsis Markers Recent Labs  Lab 10/26/2017 1025 11/22/2017 1342 11/21/17 0551  LATICACIDVEN 6.1* 2.3* 1.2   ABG Recent Labs  Lab 11/21/17 0513  PHART 7.30*  PCO2ART 54*  PO2ART 78*   Liver Enzymes Recent Labs  Lab 11/09/2017 1026  AST 46*  ALT 17  ALKPHOS 81  BILITOT 0.9  ALBUMIN 2.9*   Cardiac Enzymes Recent Labs  Lab 11/09/2017 1026  TROPONINI 0.05*   Glucose Recent Labs  Lab 11/03/2017 1329 11/09/2017 1703 11/06/2017 1938 11/17/2017 2149 11/21/17 0753 11/21/17 1237  GLUCAP 203* 220* 224* 221* 149* 132*     Recent Results (from the past 240 hour(s))  Blood Culture (routine x 2)     Status: None (Preliminary result)   Collection Time: 10/27/2017 10:25 AM  Result Value Ref Range Status   Specimen Description BLOOD RIGHT FA  Final   Special Requests   Final    BOTTLES DRAWN AEROBIC AND ANAEROBIC Blood Culture adequate volume   Culture  Setup Time   Final    GRAM NEGATIVE RODS IN BOTH AEROBIC AND ANAEROBIC BOTTLES CRITICAL RESULT CALLED TO, READ BACK BY AND VERIFIED  WITH: DAVID BESANTI 11/21/17 @ 0156  MLK Performed at [facility], [address] [place], [place] [address]    Culture GRAM NEGATIVE RODS  Final   Report Status PENDING  Incomplete  Blood Culture (routine x 2)     Status: None (Preliminary result)   Collection Time: 11/14/2017 10:25 AM  Result Value Ref Range Status   Specimen Description BLOOD LEFT HAND  Final   Special Requests   Final    BOTTLES DRAWN AEROBIC AND ANAEROBIC Blood Culture adequate volume   Culture  Setup Time   Final    Organism ID to follow GRAM NEGATIVE RODS IN BOTH AEROBIC AND ANAEROBIC BOTTLES CRITICAL RESULT CALLED TO, READ BACK BY AND VERIFIED WITH: DAVID BESANTI 11/21/17 @ 0156  MLK Performed at [facility], [address] [place], [place] [address]    Culture GRAM NEGATIVE RODS  Final   Report Status PENDING  Incomplete  Blood Culture ID Panel (Reflexed)     Status: Abnormal   Collection Time: 10/31/2017 10:25 AM  Result Value Ref Range Status   Enterococcus species NOT DETECTED NOT DETECTED Final   Listeria monocytogenes NOT DETECTED NOT DETECTED Final   Staphylococcus species NOT DETECTED NOT DETECTED Final   Staphylococcus aureus NOT DETECTED NOT DETECTED Final   Streptococcus species NOT DETECTED NOT DETECTED Final   Streptococcus agalactiae NOT DETECTED NOT DETECTED Final   Streptococcus pneumoniae NOT DETECTED NOT DETECTED Final   Streptococcus pyogenes NOT DETECTED NOT DETECTED Final   Acinetobacter baumannii NOT DETECTED NOT DETECTED Final   Enterobacteriaceae species DETECTED (A) NOT DETECTED Final    Comment: Enterobacteriaceae represent a large family of gram-negative bacteria, not a single organism. CRITICAL RESULT CALLED TO, READ BACK BY AND VERIFIED WITH: DAVID BESANTI 11/21/17 @ 0156  MLK    Enterobacter cloacae complex NOT DETECTED NOT DETECTED Final   Escherichia coli DETECTED (A) NOT DETECTED Final    Comment: CRITICAL RESULT CALLED TO, READ BACK BY AND VERIFIED  WITH: DAVID BESANTI 11/21/17 @ 0156  MLK    Klebsiella oxytoca NOT DETECTED NOT DETECTED Final   Klebsiella pneumoniae NOT DETECTED NOT DETECTED Final   Proteus species NOT DETECTED NOT DETECTED Final   Serratia marcescens NOT DETECTED NOT DETECTED Final   Carbapenem resistance NOT DETECTED NOT DETECTED Final   Haemophilus influenzae NOT DETECTED NOT DETECTED Final   Neisseria meningitidis NOT DETECTED NOT DETECTED Final   Pseudomonas aeruginosa NOT DETECTED NOT DETECTED Final   Candida albicans NOT DETECTED NOT DETECTED Final   Candida glabrata NOT DETECTED NOT DETECTED Final   Candida krusei NOT DETECTED NOT DETECTED Final   Candida parapsilosis NOT DETECTED NOT DETECTED Final  Candida tropicalis NOT DETECTED NOT DETECTED Final    Comment: Performed at [facility], [address] [place], [place] [address]  Urine culture     Status: Abnormal (Preliminary result)   Collection Time: 10/26/2017  1:37 PM  Result Value Ref Range Status   Specimen Description   Final    URINE, RANDOM Performed at [facility], [address], [place], [place] [address]    Special Requests   Final    NONE Performed at [facility], [address], [place], [place] [address]    Culture (A)  Final    80,000 COLONIES/mL GRAM NEGATIVE RODS IDENTIFICATION AND SUSCEPTIBILITIES TO FOLLOW Performed at [facility], [address], [place], [place] [address]    Report Status PENDING  Incomplete  MRSA PCR Screening     Status: None   Collection Time: 11/23/2017  1:37 PM  Result Value Ref Range Status   MRSA by PCR NEGATIVE NEGATIVE Final    Comment:        The GeneXpert MRSA Assay (FDA approved for NASAL specimens only), is one component of a comprehensive MRSA colonization surveillance program. It is not intended to diagnose MRSA infection nor to guide or monitor treatment for MRSA infections. Performed at [facility], [address], [place], [place]  [address]      Current Facility-Administered Medications:  .  0.9 %  sodium chloride infusion, , Intravenous, Continuous, Annett Fabian, MD, Last Rate: 150 mL/hr at 11/21/17 1400 .  acetaminophen (TYLENOL) suppository 650 mg, 650 mg, Rectal, Q4H PRN, Katha Hamming, MD .  acetaminophen (TYLENOL) tablet 650 mg, 650 mg, Oral, Q6H PRN, 650 mg at 11/21/17 0032 **OR** acetaminophen (TYLENOL) suppository 650 mg, 650 mg, Rectal, Q6H PRN, Katha Hamming, MD .  DOPamine (INTROPIN) 800 mg in dextrose 5 % 250 mL (3.2 mg/mL) infusion, 5 mcg/kg/min, Intravenous, Titrated, Annett Fabian, MD, Last Rate: 8.7 mL/hr at 11/21/17 1000, 5 mcg/kg/min at 11/21/17 1000 .  feeding supplement (PRO-STAT SUGAR FREE 64) liquid 30 mL, 30 mL, Oral, BID BM, Katha Hamming, MD .  heparin injection 5,000 Units, 5,000 Units, Subcutaneous, Q8H, Annett Fabian, MD, 5,000 Units at 11/21/17 1315 .  insulin aspart (novoLOG) injection 0-9 Units, 0-9 Units, Subcutaneous, TID WC, Katha Hamming, MD, 1 Units at 11/21/17 1315 .  levETIRAcetam (KEPPRA) 750 mg in sodium chloride 0.9 % 100 mL IVPB, 750 mg, Intravenous, Q12H, Katha Hamming, MD, Stopped at 11/21/17 1330 .  MEDLINE mouth rinse, 15 mL, Mouth Rinse, BID, Blakeney, Dana G, NP, 15 mL at 11/21/17 1120 .  meropenem (MERREM) 1 g in sodium chloride 0.9 % 100 mL IVPB, 1 g, Intravenous, Q12H, Blakeney, Neldon Newport, NP, Stopped at 11/21/17 1150 .  [DISCONTINUED] ondansetron (ZOFRAN) tablet 4 mg, 4 mg, Oral, Q6H PRN **OR** ondansetron (ZOFRAN) injection 4 mg, 4 mg, Intravenous, Q6H PRN, Katha Hamming, MD  IMAGING    Dg Chest Port 1 View  Result Date: 11/21/2017 CLINICAL DATA:  Respiratory failure EXAM: PORTABLE CHEST 1 VIEW COMPARISON:  November 20, 2017 FINDINGS: There is atelectatic change in the left base. Lungs elsewhere clear. Heart is upper normal in size with pulmonary vascularity within normal limits. No adenopathy. There is aortic  atherosclerosis. No bone lesions. IMPRESSION: Left base atelectasis. Lungs elsewhere clear. Stable cardiac silhouette. There is aortic atherosclerosis. Aortic Atherosclerosis (ICD10-I70.0). Electronically Signed   By: Bretta Bang III M.D.   On: 11/21/2017 07:34       MAJOR  EVENTS/TEST RESULTS: Confusion overnight  INDWELLING DEVICES:: Foley  MICRO DATA: MRSA PCR negative Urine GNB Blood GNB Resp   ANTIMICROBIALS:  3/26 Zosyn, Vancomycin D/C 3/27 Meropenem   ASSESSMENT/PLAN    1. Severe sepsis with shock requiring vasopressor has resolved 2. Acute metabolic encephalopathy secondary to severe sepsis much improved mental status 3. GNB UTI- ID and sensitivity pending 4. GNB bacteremia- ID and sensitivity pending 5. Bilateral Atelectatics 6. Hypovolemia has resolved 7. Symptomatic bradycardia from sepsis- has resolved 8. Acute Kidney Injury from sepsis/UTI has improved 9. Hx of Seizure disorder 10. Confusion- avoid benzodiazepine  Plan: 1. Antibiotic changed to meropenum; follow up cultures 2. Will decrease IVF to 75 ml/hr 4. Anticonvulsive therapy/precaution  5. Pyrexia control/ pain control, avoid benzodiazepine  6. Palliative medicine on consult 7. DVT prophylaxis 8. NPO, swallow evaluation and start diet as tolerate as patient asking for water  I have updated the patient's son on the patient's condition and goals of therapy.  Disp: transfer to medical floor with sitter    Jackson LatinoKarol Harlene Petralia, M.D

## 2017-11-21 NOTE — Progress Notes (Signed)
[facility]  Fever 103, given tylenol suppository.  Respirations 40, O2 at 4L , O2 sat 93%.  BP stable.  Lungs with crackles in base.  Total 2000 cc in and about 1000cc out.  Spoke with Dr Luberta MutterKonidena and order received for 20 mg IVP lasix.  Given.  Charge RN aware of pt condition and also assessed pt.  Pt not responding to questions but opens eyes.  Pt restless in bed at times.  Low bed and mat in place.  Telemonitor on. Will continue to check vs frequently. Henriette CombsSarah Gissell Barra RN

## 2017-11-21 NOTE — Progress Notes (Signed)
Responded to Rapid Response. Pt. Having rapid shallow respirations with interval periods of apnea.  Pt. Placed on BIPAP per Dr. Caryn BeeMaier. Pt. Tolerating well. Will continue to assess.

## 2017-11-21 NOTE — Progress Notes (Signed)
PHARMACY - PHYSICIAN COMMUNICATION CRITICAL VALUE ALERT - BLOOD CULTURE IDENTIFICATION (BCID)  Patrick Chan is an 84 y.o. male who [facility]07[facility]2017 with a chief complaint of AMS  Assessment:  3/4 SIRS (febrile, tachypneic/cardic, WBC 14.7) + 2.6 LA, UA LE +, 4/4 GNR BCID E. Coli KPC -  Name of physician (or Provider) Contacted: Sonda Rumbleana Blakeney  Current antibiotics: Vanc/zosyn  Changes to prescribed antibiotics recommended:  Recommendations accepted by provider -- will discontinue vanc/zosyn and will start meropenem 1g IV q12h  Results for orders placed or performed during the hospital encounter of 11/19/2017  Blood Culture ID Panel (Reflexed) (Collected: 11/09/2017 10:25 AM)  Result Value Ref Range   Enterococcus species NOT DETECTED NOT DETECTED   Listeria monocytogenes NOT DETECTED NOT DETECTED   Staphylococcus species NOT DETECTED NOT DETECTED   Staphylococcus aureus NOT DETECTED NOT DETECTED   Streptococcus species NOT DETECTED NOT DETECTED   Streptococcus agalactiae NOT DETECTED NOT DETECTED   Streptococcus pneumoniae NOT DETECTED NOT DETECTED   Streptococcus pyogenes NOT DETECTED NOT DETECTED   Acinetobacter baumannii NOT DETECTED NOT DETECTED   Enterobacteriaceae species DETECTED (A) NOT DETECTED   Enterobacter cloacae complex NOT DETECTED NOT DETECTED   Escherichia coli DETECTED (A) NOT DETECTED   Klebsiella oxytoca NOT DETECTED NOT DETECTED   Klebsiella pneumoniae NOT DETECTED NOT DETECTED   Proteus species NOT DETECTED NOT DETECTED   Serratia marcescens NOT DETECTED NOT DETECTED   Carbapenem resistance NOT DETECTED NOT DETECTED   Haemophilus influenzae NOT DETECTED NOT DETECTED   Neisseria meningitidis NOT DETECTED NOT DETECTED   Pseudomonas aeruginosa NOT DETECTED NOT DETECTED   Candida albicans NOT DETECTED NOT DETECTED   Candida glabrata NOT DETECTED NOT DETECTED   Candida krusei NOT DETECTED NOT DETECTED   Candida parapsilosis NOT DETECTED  NOT DETECTED   Candida tropicalis NOT DETECTED NOT DETECTED    Thomasene Rippleavid Britanni Yarde, PharmD, BCPS Clinical Pharmacist 11/21/2017

## 2017-11-21 NOTE — Progress Notes (Addendum)
Pt was breathing slower but now with rapid shallow breaths then periods of apnea.  HR from 60 to 138.  Temp increased from 103,  99  to 101.3 axillary. BP 115/83.  Called charge RN, [facility] and PRIME doc.  Consulting civil engineerCharge RN in room.  Awaiting others. Henriette CombsSarah Panhia Karl RN Rapid called and [facility].  Spoke with Dr Caryn BeeMaier.  Pt to be placed on BIPAP.  No other orders at this time.  Called son Tammy SoursGreg at [phone] but had to leave voice message.  Asked son to call.  Henriette CombsSarah Danyetta Gillham RN

## 2017-11-21 NOTE — Progress Notes (Signed)
   11/21/17 2300  Clinical Encounter Type  Visited With Patient;Health care provider  Visit Type Follow-up (rapid response)  Spiritual Encounters  Spiritual Needs Prayer   Chaplain received page for rapid response.  Upon arrival, care team was discussing patient's health and treatment options.  Chaplain maintained pastoral presence, offering silent prayers for patient and care team.  After team left, chaplain prayed for patient and family.  Chaplain then checked in with staff who reported leaving a message for patient's son.  Chaplain will check back in 20 to 30 minutes.  On the way back to the on-call suite, [facility]plain [facility] who asked if patient's son was coming.  Chaplain reported on what staff said and on chaplain's planned follow up.

## 2017-11-21 NOTE — Consult Note (Signed)
Consultation Note Date: 11/21/2017   Patient Name: Patrick Chan  DOB: Sep 20, 1921  MRN: [id number]  Age / Sex: 84 y.o., male  PCP: Idelle Crouch, MD Referring Physician: Max Sane, MD  Reason for Consultation: Establishing goals of care  HPI/Patient Profile: 84 y.o. male  admitted on 11/10/2017 from [facility] skilled facility with fever, altered mental status, and lethargy. He has a past medical history of seizure disorder, paroxysmal atrial fibrillation, hypertension, BPH, sleep apnea, and diabetes type 2 (uncomplicated). During his course in ED family reported that the patient was experiencing shaking with chills the day prior to admission. The patient presented with fever, altered mental status, bradycardia, heart rate found to be 29 at one point as per char, and lethargic. History was obtained from patient sons and also from ER charts. Temperature 103.1 as per EMS, started on BiPAP upon arrival because of tachypnea, respiratory distress.  He was weaned off of bipap and placed on  nonrebreather with 15 L of oxygen. Eventually abled to wean to nasal cannula. During ED course he was found to be hypotensive with blood pressure up to 95/52 and received about 3 L of normal saline.    Clinical Assessment and Goals of Care: I have reviewed medical records including lab results, imaging, Epic notes, and MAR, received report from the bedside RN, and assessed the patient. Elmo Putt, NP and I met at the bedside along with son Gershon Mussel)  to discuss diagnosis prognosis, GOC, EOL wishes, disposition and options.  We introduced Palliative Medicine as specialized medical care for people living with serious illness. It focuses on providing relief from the symptoms and stress of a serious illness. The goal is to improve quality of life for both the patient and the family.  We discussed a brief life review of the patient. He has 3  sons who he put through college. Family is very important to him. He retired at the age of 84 years old and this was only due to a fall resulting in a hip fracture.   As far as functional and nutritional status family reports father living in an assisted living up until the early part of last year. They noticed a decline after recovering from a serious UTI. Son reports from then he was moved to the SNF portion of the facility as he was no longer able to walk independently and was wheelchair bound. He is totally dependent on assistance with his ADLs including mobility and transfers. He is able to feed himself, however, his appetite has decreased and they have noticed some weight loss over the months. Son reports his "world is small now as he only sits in the wheelchair in front of the tv everyday, which he can't hear!"   We discussed his current illness and what it means in the larger context of his on-going co-morbidities.  Natural disease trajectory and expectations at EOL were discussed. Conversations initiated regarding patients condition and the future outlook. Family reported that they were ok with his this process and feels as  long as he can return to his baseline or close to it they are feel that this is important. They are aware that the process will take some time to see if he will return to baseline. It will be a day by day process. Son verbalized his understanding and states they are aware that it may take time. Family voiced that patient is a DNR and has lived a good quality of life for 8 years and if he passed away of a natural death they are more than accepting of this.   Questions and concerns were addressed.  The family was encouraged to call with questions or concerns.  PMT will continue to support holistically.  HCPOA- Tom Rehberg/Greg Tinkle (Sons)   SUMMARY OF RECOMMENDATIONS    DNR/DNI per family  Attending providers to continue to treat the treatable per family request. With  goal of returning to [facility]. Family is hopeful that he will return to baseline or near it. Understands this will require watchful waiting.   Palliative Medicine will continue to support medical team and family during hospitalization.   Code Status/Advance Care Planning:  DNR per attending and family's request   Palliative Prophylaxis:   Aspiration, Bowel Regimen, Delirium Protocol, Frequent Pain Assessment, Oral Care and Turn Reposition  Additional Recommendations (Limitations, Scope, Preferences):  Full Scope Treatment-per family continue to treat the treatable.   Psycho-social/Spiritual:   Desire for further Chaplaincy support:No  Prognosis:   Unable to determine. Will depend on his ability to recover from this infection.   Discharge Planning: At family's request hopeful he will be able to be discharged back to [facility] facilities       Primary Diagnoses: Present on Admission: . Septic shock ([place])   I have reviewed the medical record, interviewed the patient and family, and examined the patient. The following aspects are pertinent.  Past Medical History:  Diagnosis Date  . Arthritis   . BPH (benign prostatic hyperplasia)   . Chicken pox   . DDD (degenerative disc disease), lumbosacral    LS spine with HNP S1 by MRI 8/08  . Depression   . Diabetes mellitus type 2, uncomplicated ([place])   . Diabetes mellitus without complication ([place])   . History of colonic polyps   . Hypertension   . Nephrolithiasis   . Seizures ([place])   . Sleep apnea    Social History   Socioeconomic History  . Marital status: Widowed    Spouse name: Not on file  . Number of children: 3  . Years of education: 29  . Highest education level: High school graduate  Occupational History  . Occupation: retired    Comment: [employer]  Social Needs  . Financial resource strain: Not on file  . Food insecurity:    Worry: Not on file    Inability: Not on file  . Transportation needs:      Medical: Not on file    Non-medical: Not on file  Tobacco Use  . Smoking status: Former Research scientist (life sciences)  . Smokeless tobacco: Never Used  Substance and Sexual Activity  . Alcohol use: No  . Drug use: No  . Sexual activity: Not on file  Lifestyle  . Physical activity:    Days per week: Not on file    Minutes per session: Not on file  . Stress: Not on file  Relationships  . Social connections:    Talks on phone: Not on file    Gets together: Not on file  Attends religious service: Not on file    Active member of club or organization: Not on file    Attends meetings of clubs or organizations: Not on file    Relationship status: Not on file  Other Topics Concern  . Not on file  Social History Narrative   Former smoker   Denies alcohol use   3 sons   Widowed   Full Code   Family History  Problem Relation Age of Onset  . Stroke Mother   . Heart attack Mother   . Heart attack Father    Scheduled Meds: . feeding supplement (PRO-STAT SUGAR FREE 64)  30 mL Oral BID BM  . heparin injection (subcutaneous)  5,000 Units Subcutaneous Q8H  . insulin aspart  0-9 Units Subcutaneous TID WC  . mouth rinse  15 mL Mouth Rinse BID   Continuous Infusions: . sodium chloride 150 mL/hr at 11/21/17 0600  . DOPamine 5 mcg/kg/min (11/21/17 0600)  . levETIRAcetam Stopped (11/21/17 0146)  . meropenem (MERREM) IV 1 g (11/21/17 1120)   PRN Meds:.acetaminophen, acetaminophen **OR** acetaminophen, [DISCONTINUED] ondansetron **OR** ondansetron (ZOFRAN) IV Medications Prior to Admission:  Prior to Admission medications   Medication Sig Start Date End Date Taking? Authorizing Provider  acetaminophen (TYLENOL) 650 MG suppository Place 650 mg rectally every 4 (four) hours as needed for fever. Give 1 suppository Q 4 hours for fever if unable to take med PO. Maximum dose for 24 hours is 3,000 mg for all sources of Acetaminophen/ Tylenol   Yes [provider]  Amino Acids-Protein Hydrolys (FEEDING  SUPPLEMENT, PRO-STAT SUGAR FREE 64,) LIQD Take 30 mLs 2 (two) times daily between meals by mouth.   Yes [provider]  apixaban (ELIQUIS) 2.5 MG TABS tablet Take 2.5 mg by mouth 2 (two) times daily.    Yes [provider]  citalopram (CELEXA) 20 MG tablet Take 20 mg daily by mouth.    Yes [provider]  docusate sodium (COLACE) 100 MG capsule Take 100 mg by mouth 2 (two) times daily.    Yes [provider]  finasteride (PROSCAR) 5 MG tablet Take 5 mg daily by mouth.    Yes [provider]  glimepiride (AMARYL) 2 MG tablet Take 2 mg daily with breakfast by mouth.    Yes [provider]  Infant Care Products (DERMACLOUD) CREA Apply ointment topically once daily   Yes [provider]  levETIRAcetam (KEPPRA) 750 MG tablet Take 750 mg by mouth 2 (two) times daily.    Yes [provider]  LUMIGAN 0.01 % SOLN Place 1 drop into both eyes daily.  05/14/17  Yes [provider]  magnesium hydroxide (MILK OF MAGNESIA) 400 MG/5ML suspension Take 30 mLs by mouth every 4 (four) hours as needed for mild constipation. If constipation / no BM for 2 days   Yes [provider]  Melatonin 3 MG TABS Take 1 tablet at bedtime by mouth.   Yes [provider]  metoprolol tartrate (LOPRESSOR) 25 MG tablet Take 25 mg daily by mouth.   Yes [provider]  Multiple Vitamin (MULTIVITAMIN WITH MINERALS) TABS tablet Take 1 tablet by mouth daily.   Yes [provider]  pravastatin (PRAVACHOL) 10 MG tablet Take 10 mg by mouth once a week. On Sunday   Yes [provider]  traZODone (DESYREL) 50 MG tablet Take 50 mg at bedtime by mouth.    Yes [provider]   No Known Allergies Review of Systems  Unable to perform ROS: Acuity of condition    Physical Exam  Constitutional: He appears well-developed. He is sleeping. He appears ill.  Cardiovascular: Normal rate. An irregular rhythm present.    Pulmonary/Chest: Effort normal.  Musculoskeletal:  Generalized weakness   Neurological: He is disoriented.  Psychiatric: Cognition and memory are impaired.    Vital Signs: BP 134/87   Pulse 82   Temp (!) 97.5 F (36.4 C)   Resp (!) 27   Ht 6' 1" (1.854 m)   Wt 92 kg (202 lb 13.2 oz)   SpO2 95%   BMI 26.76 kg/m  Pain Scale: 0-10   Pain Score: Asleep   SpO2: SpO2: 95 % O2 Device:SpO2: 95 % O2 Flow Rate: .O2 Flow Rate (L/min): 4 L/min  IO: Intake/output summary:   Intake/Output Summary (Last 24 hours) at 11/21/2017 1124 Last data filed at 11/21/2017 0600 Gross per 24 hour  Intake 5093.48 ml  Output 1870 ml  Net 3223.48 ml    LBM: Last BM Date: (prior to admission ) Baseline Weight: Weight: 95.3 kg (210 lb) Most recent weight: Weight: 92 kg (202 lb 13.2 oz)     Palliative Assessment/Data:PPS 30%    Time In: 1030 Time Out: 1130 Time Total: 60 min.  Greater than 50%  of this time was spent counseling and coordinating care related to the above assessment and plan.  Signed by: Alda Lea, NP-BC Palliative Medicine Team  Phone: [phone] Fax: [phone]  Vinie Sill, NP Palliative Medicine Team Pager # [phone] (M-F 8a-5p) Team Phone # [phone] (Nights/Weekends)   Please contact Palliative Medicine Team phone at [phone] for questions and concerns.  For individual provider: See Shea Evans

## 2017-11-21 NOTE — Progress Notes (Signed)
Pt being transferred to room 110. [facility]. RN requested sedation for pt due to pt's behavior. Discussed same with Dr. Peggye Pittichards, she stated that the pt is confused and agitated due to sedative use during his hospitalization and did not want to place a PRN order for sedatives. Pt and belongings transferred to room 110 via [place], NT transporter without incident.

## 2017-11-21 NOTE — Progress Notes (Signed)
Family Meeting Note  Advance Directive:yes  Today a meeting took place with the Patient and Son at bedside.  Patient is unable to participate due ZO:XWRUEAto:Lacked capacity Confused   The following clinical team members were present during this meeting:MD  The following were discussed:Patient's diagnosis:   84 y.o. male  [facility]02[facility] with fever, altered mental status, and lethargy. He has a past medical history of seizure disorder, paroxysmal atrial fibrillation, hypertension, BPH, sleep apnea, and diabetes type 2 (uncomplicated). Admitted for septic shock due to UTI.  Patient's progosis: < 12 months and Goals for treatment: DNR  Additional follow-up to be provided: Palliative care to continue talks with son - I had long talk with son and explained poor prognosis and ongoing clinical decline. Hope for transition to Facility with Hospice if qualifies or Palliative care. Son is in agreement.  Time spent during discussion:20 minutes  Delfino LovettVipul Rosselyn Martha, MD

## 2017-11-21 NOTE — Progress Notes (Signed)
Pt's foley cath noted to be leaking around urethral meatus. Attempted to irrigate same and blood clot noted in catheter. Also noticed that catheter size was 47F. Foley cath removed and 59F re-inserted. Immediate return of urine noted and no leakage. Will continue to monitor.

## 2017-11-22 DIAGNOSIS — J9601 Acute respiratory failure with hypoxia: Secondary | ICD-10-CM

## 2017-11-22 DIAGNOSIS — J9602 Acute respiratory failure with hypercapnia: Secondary | ICD-10-CM

## 2017-11-22 DIAGNOSIS — J9690 Respiratory failure, unspecified, unspecified whether with hypoxia or hypercapnia: Secondary | ICD-10-CM

## 2017-11-22 LAB — BASIC METABOLIC PANEL
Anion gap: 14 (ref 5–15)
BUN: 24 mg/dL — AB (ref 6–20)
CO2: 16 mmol/L — ABNORMAL LOW (ref 22–32)
CREATININE: 1 mg/dL (ref 0.61–1.24)
Calcium: 8.4 mg/dL — ABNORMAL LOW (ref 8.9–10.3)
Chloride: 114 mmol/L — ABNORMAL HIGH (ref 101–111)
GFR calc Af Amer: >60 mL/min (ref >60)
GLUCOSE: 196 mg/dL — AB (ref 65–99)
Potassium: 3.9 mmol/L (ref 3.5–5.1)
Sodium: 144 mmol/L (ref 135–145)

## 2017-11-22 LAB — URINE CULTURE: Culture: 80000 — AB

## 2017-11-22 LAB — PHOSPHORUS: Phosphorus: 2.9 mg/dL (ref 2.5–4.6)

## 2017-11-22 LAB — CBC
HEMATOCRIT: 40.9 % (ref 40.0–52.0)
HEMOGLOBIN: 13.3 g/dL (ref 13.0–18.0)
MCH: 27.5 pg (ref 26.0–34.0)
MCHC: 32.6 g/dL (ref 32.0–36.0)
MCV: 84.5 fL (ref 80.0–100.0)
Platelets: 192 10*3/uL (ref 150–440)
RBC: 4.84 MIL/uL (ref 4.40–5.90)
RDW: 16.8 % — ABNORMAL HIGH (ref 11.5–14.5)
WBC: 20.7 10*3/uL — AB (ref 3.8–10.6)

## 2017-11-22 LAB — GLUCOSE, CAPILLARY: Glucose-Capillary: 193 mg/dL — ABNORMAL HIGH (ref 65–99)

## 2017-11-22 LAB — MAGNESIUM: Magnesium: 1.8 mg/dL (ref 1.7–2.4)

## 2017-11-22 MED ORDER — LORAZEPAM 2 MG/ML IJ SOLN
1.0000 mg | INTRAMUSCULAR | Status: DC | PRN
  Administered 2017-11-23: 15:00:00 1 mg via INTRAVENOUS
  Filled 2017-11-22: qty 1

## 2017-11-22 MED ORDER — GLYCOPYRROLATE 0.2 MG/ML IJ SOLN
0.2000 mg | INTRAMUSCULAR | Status: DC | PRN
  Filled 2017-11-22: qty 1

## 2017-11-22 MED ORDER — MORPHINE SULFATE (PF) 2 MG/ML IV SOLN: 2.0000 mg | INTRAVENOUS | Status: DC | PRN

## 2017-11-22 MED ORDER — SODIUM CHLORIDE 0.9 % IV SOLN
1.0000 g | Freq: Three times a day (TID) | INTRAVENOUS | Status: DC
  Administered 2017-11-22 – 2017-11-23 (×2): 1 g via INTRAVENOUS
  Filled 2017-11-22 (×4): qty 1

## 2017-11-22 MED ORDER — POLYVINYL ALCOHOL 1.4 % OP SOLN
1.0000 [drp] | Freq: Four times a day (QID) | OPHTHALMIC | Status: DC | PRN
  Filled 2017-11-22: qty 15

## 2017-11-22 MED ORDER — MORPHINE SULFATE (PF) 2 MG/ML IV SOLN
2.0000 mg | INTRAVENOUS | Status: DC | PRN
  Administered 2017-11-22 – 2017-11-23 (×5): 2 mg via INTRAVENOUS
  Filled 2017-11-22 (×5): qty 1

## 2017-11-22 MED ORDER — BIOTENE DRY MOUTH MT LIQD: 15.0000 mL | OROMUCOSAL | Status: DC | PRN

## 2017-11-22 MED ORDER — LORAZEPAM 2 MG/ML IJ SOLN: 1.0000 mg | Freq: Four times a day (QID) | INTRAMUSCULAR | Status: DC | PRN

## 2017-11-22 NOTE — Progress Notes (Addendum)
[place] [facility] [place] NAME: Patrick Chan    MR#:  [id number]  DATE OF BIRTH:  September 28, 1921  SUBJECTIVE:  CHIEF COMPLAINT:   Chief Complaint  Patient presents with  . Bradycardia   Patient is drowsy.  On BiPAP.  Respiratory distress.  REVIEW OF SYSTEMS:  Review of Systems  Unable to perform ROS: Mental acuity   DRUG ALLERGIES:  No Known Allergies VITALS:  Blood pressure 111/77, pulse 63, temperature 98 F (36.7 C), temperature source Oral, resp. rate (!) 42, height 6' 1"  (1.854 m), weight 93 kg (205 lb), SpO2 99 %. PHYSICAL EXAMINATION:  Physical Exam  Constitutional: He is well-developed, well-nourished, and in no distress. He appears unhealthy. He appears toxic. He has a sickly appearance.  HENT:  Head: Normocephalic and atraumatic.  Eyes: Pupils are equal, round, and reactive to light. Conjunctivae and EOM are normal.  Neck: Normal range of motion. Neck supple. No tracheal deviation present. No thyromegaly present.  Cardiovascular: Normal rate, regular rhythm and normal heart sounds.  Pulmonary/Chest: Accessory muscle usage present. Tachypnea noted. He is in respiratory distress. He has decreased breath sounds. He has no wheezes. He exhibits no tenderness.  Abdominal: Soft. Bowel sounds are normal. He exhibits no distension. There is no tenderness.  Musculoskeletal: Normal range of motion.  Neurological: No cranial nerve deficit.  Drowsy.  Following instructions.  Skin: Skin is warm and dry. No rash noted.   LABORATORY PANEL:  Male CBC Recent Labs  Lab 11/22/17 0749  WBC 20.7*  HGB 13.3  HCT 40.9  PLT 192   ------------------------------------------------------------------------------------------------------------------ Chemistries  Recent Labs  Lab 11/22/2017 1026  11/22/17 0711  NA 138   < > 144  K 4.4   < > 3.9  CL 104   < > 114*  CO2 20*   < > 16*  GLUCOSE 250*   < > 196*  BUN 35*   < > 24*  CREATININE  1.58*   < > 1.00  CALCIUM 9.1   < > 8.4*  MG  --    < > 1.8  AST 46*  --   --   ALT 17  --   --   ALKPHOS 81  --   --   BILITOT 0.9  --   --    < > = values in this interval not displayed.   RADIOLOGY:  No results found. ASSESSMENT AND PLAN:  84 year old male patient with multiple medical problems of seizure disorder, paroxysmal atrial fibrillation, hypertension, diabetes mellitus type 2, chronic kidney disease stage II comes in with altered mental status, fever, chills, bradycardia.  Patient comes from nursing home.  *Acute hypoxic respiratory failure secondary to sepsis.  Continue BiPAP support.  Critically ill.  High risk for deterioration and death.  * Septic shock secondary to UTI Septic shock has resolved.  Patient continues to have severe sepsis.  * Acute toxic and metabolic encephalopathy secondary to severe sepsis   * GNR UTI & bacteremia Final ID and sensitivity pending.  Infectious disease consulted.  On broad-spectrum IV antibiotics with meropenem.  * Transient bradycardia from sepsis- has resolved  * Acute Kidney Injury from sepsis/UTI - now resolved  * Hx of Seizure disorder: on Keppra IV  * Confusion- avoid benzodiazepine  * History of proximal atrial fibrillation: Patient takes Eliquis at nursing home but because of lethargy keep him n.p.o., continue Lovenox. Await ST eval for swallow evaluation once awake  * History of  diabetes mellitus type 2: sliding scale insulin with coverage.  * Severe protein calorie malnutrition.  * history of BPH: has Foley, monitor urine output   All the records are reviewed and case discussed with Care Management/Social Worker. Management plans discussed with the patient, family (son at bedside) and they are in agreement.  CODE STATUS: DNR  TOTAL CRITICAL CARE TIME TAKING CARE OF THIS PATIENT: 45 minutes.   Met with patient's son at bedside.  Explained the critical illness.  Patient is on BiPAP at this time due to sepsis  and acute hypoxic respiratory failure.  Continues to deteriorate.  We discussed regarding aggressive care versus palliative approach with comfort measures.  He is waiting for 2 of his brothers to arrive at the hospital.  He wants BiPAP continued here on the medical floor and not transferred to ICU.  Patient has always made it clear he does not want to be on aggressive life support.  We will re-eval once brothers arrive and see if we can get patient off the BiPAP. I have also discussed with palliative care team.   Neita Carp M.D on 11/22/2017 at 10:06 AM  Between 7am to 6pm - Pager - [phone]  After 6pm go to [email or web address] - Proofreader  [employer]  Hospitalists  Office  [phone]  CC: Primary care physician; Idelle Crouch, MD  Note: This dictation was prepared with Dragon dictation along with smaller phrase technology. Any transcriptional errors that result from this process are unintentional.

## 2017-11-22 NOTE — Progress Notes (Signed)
Patient removed from bipap per family request and placed on 6l [place]. Family requests to keep bipap at bedside in case patient needs for comfort.

## 2017-11-22 NOTE — Progress Notes (Signed)
Pt resting comfortably.  Still has increased respirations with periods of apnea.  HR continues to be labile , irregular, afib. Henriette CombsSarah Chelby Salata RN

## 2017-11-22 NOTE — Progress Notes (Signed)
Patient continued on BiPAP as requested by son, Tammy SoursGreg.  Later on Tammy SoursGreg was joined by [place] in the room.  We had a discussion regarding patient's acute infection, prognosis.  Also present was Yong ChannelAlicia Parker NP.  We discussed regarding patient not improving in spite of aggressive treatment with meropenem.  Cultures are showing E. coli which is sensitive to meropenem.  Patient continues to be drowsy on BiPAP.  Poor oral intake.  I explained that with no response to BiPAP and continuing to be drowsy I would have considered intubation on full ventilatory support but this is not what patient wanted.  Advised that we remove the BiPAP and reassess how patient does.  And if any worsening transition to complete comfort measures.  Both her sons present in the room requested that we wait for the third son to arrive and continue BiPAP.  Later on since her third son has not arrived family has requested that we remove the BiPAP and see how patient does.  Discussed that we would continue the antibiotics.  Stop IV fluids.  Patient is awake at times but falls back asleep easily.  If patient has any further worsening will have to transition to comfort measures.  At this time patient is DO NOT RESUSCITATE DO NOT INTUBATE. No further blood work or new IV lines at this time.  Continue Foley catheter.  Further critical care time spent 40 minutes.

## 2017-11-22 NOTE — Progress Notes (Signed)
Rapid Response Event Note  Overview:  Rapid response was called to room 110, [name] at 23:00.      Initial Focused Assessment:  Upon arrival, pt was found lying in bed, responsive but not verbal, having rapid, shallow breathing with periods of apnea.  RT was present in the room as well as the [facility], the charge nurse, the main nurse and the chaplain.  Prime doc was called.   Interventions:  Pt is to be placed on BiPap.  Pt's son is to be called by pt's nurse and updated on pt's condition.  Pt is to remain in room 110.  Plan of Care (if not transferred):  Pt is to remain in room 110 on BiPap.    Event Summary:   at      at          Patrick Chan R

## 2017-11-22 NOTE — Progress Notes (Addendum)
Palliative:  I met today at Patrick Chan bedside with son Patrick Chan and his wife and son Patrick Chan (I met Tom yesterday). Unfortunately Patrick Chan has had an acute decline overnight and now on BiPAP. Discussed with Dr. Darvin Neighbours and family regarding poor prognosis and decline towards EOL. Family has expressed desire to remove BiPAP when their other brother arrives. Patrick Chan is resting comfortably on BiPAP at this time.   Late entry: Checked back in with family who are anxious that their brother has not arrived and concerned that Patrick Chan is now arousing and pulling at BiPAP mask. Decision made to remove BiPAP and hope that their brother arrives soon. They are undecided if they would want BiPAP if he has further respiratory decline prior to his sons arrival. Otherwise we will continue antibiotics for now but otherwise focus on comfort. Patrick Chan is alert off BiPAP but appears very fatigued and unsure how long he will be able to maintain. Discussed prn medication to ensure comfort and discussed signs of discomfort and distress and when and how these medications would be beneficial to Patrick Chan. All questions/concerns addressed. Emotional support provided.   Late entry: Other son has arrived and questions answered and emotional support provided. Verified that we will not add BiPAP but provide comfort with further decline. D/C BiPAP and may remove from room.   74 min  Vinie Sill, NP Palliative Medicine Team Pager # [phone] (M-F 8a-5p) Team Phone # [phone] (Nights/Weekends)

## 2017-11-22 NOTE — Progress Notes (Signed)
Pharmacy Antibiotic Note  Patrick Chan is a 84 y.o. male admitted on Aug 10, 2018 with bacteremia.  Pharmacy has been consulted for meropenem dosing.  Plan: Increase dose to meropenem 1 g IV q8h  Height: 6\' 1"  (185.4 cm) Weight: 205 lb (93 kg) IBW/kg (Calculated) : 79.9  Temp (24hrs), Avg:100 F (37.8 C), Min:98 F (36.7 C), Max:103 F (39.4 C)  Recent Labs  Lab 21-May-2018 1025 21-May-2018 1026 21-May-2018 1342 11/21/17 0551 11/22/17 0711 11/22/17 0749  WBC  --  14.7*  --  11.2*  --  20.7*  CREATININE  --  1.58*  --  1.13 1.00  --   LATICACIDVEN 6.1*  --  2.3* 1.2  --   --     Estimated Creatinine Clearance: 52.2 mL/min (by C-G formula based on SCr of 1 mg/dL).    No Known Allergies  Antimicrobials this admission:  Dose adjustments this admission:  Microbiology results: 3/26 BCx: E.coli 3/26 UCx: E.coli   Thank you for allowing pharmacy to be a part of this patient's care.  Cindi CarbonMary M Kalieb Freeland, PharmD, BCPS Clinical Pharmacist 11/22/2017 1:07 PM

## 2017-11-23 DIAGNOSIS — J9600 Acute respiratory failure, unspecified whether with hypoxia or hypercapnia: Secondary | ICD-10-CM

## 2017-11-23 DIAGNOSIS — A4151 Sepsis due to Escherichia coli [E. coli]: Principal | ICD-10-CM

## 2017-11-23 LAB — CULTURE, BLOOD (ROUTINE X 2): Special Requests: ADEQUATE

## 2017-11-23 LAB — LEVETIRACETAM LEVEL: Levetiracetam Lvl: 35.4 ug/mL (ref 10.0–40.0)

## 2017-11-23 MED ORDER — MORPHINE BOLUS VIA INFUSION
2.0000 mg | INTRAVENOUS | Status: DC | PRN
  Administered 2017-11-23: 2 mg via INTRAVENOUS
  Administered 2017-11-23: 4 mg via INTRAVENOUS
  Filled 2017-11-23: qty 4

## 2017-11-23 MED ORDER — HYDROMORPHONE HCL 1 MG/ML IJ SOLN
1.0000 mg | INTRAMUSCULAR | Status: DC | PRN
  Administered 2017-11-23 (×2): 1 mg via INTRAVENOUS
  Filled 2017-11-23 (×2): qty 1

## 2017-11-23 MED ORDER — GLYCOPYRROLATE 0.2 MG/ML IJ SOLN
0.4000 mg | Freq: Once | INTRAMUSCULAR | Status: AC
  Administered 2017-11-23: 18:00:00 0.4 mg via INTRAVENOUS
  Filled 2017-11-23: qty 2

## 2017-11-23 MED ORDER — MORPHINE 100MG IN NS 100ML (1MG/ML) PREMIX INFUSION
2.0000 mg/h | INTRAVENOUS | Status: DC
  Administered 2017-11-23 (×3): 2 mg/h via INTRAVENOUS
  Filled 2017-11-23: qty 100

## 2017-11-23 NOTE — Progress Notes (Signed)
[facility] [place] [facility]   PATIENT NAME: Patrick Chan    MR#:  [id number]  DATE OF BIRTH:  March 17, 1922  SUBJECTIVE:  CHIEF COMPLAINT:   Chief Complaint  Patient presents with  . Bradycardia   Patient is drowsy.  On 5 L oxygen. Foley catheter in place.  REVIEW OF SYSTEMS:    Review of Systems  Unable to perform ROS: Mental status change    DRUG ALLERGIES:  No Known Allergies  VITALS:  Blood pressure 106/70, pulse (!) 124, temperature (!) 97.5 F (36.4 C), temperature source Axillary, resp. rate 18, height  (1.854 m), weight 98.9 kg (218 lb), SpO2 96 %.  PHYSICAL EXAMINATION:   Physical Exam  GENERAL:  84 y.o.-year-old patient lying in the bed , gasping for breath HEENT: Head atraumatic, normocephalic. LUNGS: Coarse breath sounds CARDIOVASCULAR: S1, S2, tachycardia ABDOMEN: Soft EXTREMITIES: No cyanosis PSYCHIATRIC: The patient is drowsy SKIN: No rash  LABORATORY PANEL:   CBC Recent Labs  Lab 11/22/17 0749  WBC 20.7*  HGB 13.3  HCT 40.9  PLT 192   ------------------------------------------------------------------------------------------------------------------ Chemistries  Recent Labs  Lab 2017/12/05 1026  11/22/17 0711  NA 138   < > 144  K 4.4   < > 3.9  CL 104   < > 114*  CO2 20*   < > 16*  GLUCOSE 250*   < > 196*  BUN 35*   < > 24*  CREATININE 1.58*   < > 1.00  CALCIUM 9.1   < > 8.4*  MG  --    < > 1.8  AST 46*  --   --   ALT 17  --   --   ALKPHOS 81  --   --   BILITOT 0.9  --   --    < > = values in this interval not displayed.   ------------------------------------------------------------------------------------------------------------------  Cardiac Enzymes Recent Labs  Lab 12-05-2017 1026  TROPONINI 0.05*   ------------------------------------------------------------------------------------------------------------------  RADIOLOGY:  No results found.   ASSESSMENT AND PLAN:   *Acute hypoxic  respiratory failure *Septic shock secondary to E. coli UTI and bacteremia *Acute toxic and metabolic encephalopathy *Transient bradycardia *Acute kidney injury due to ATN *History of seizure disorder *Paroxysmal atrial fibrillation *Diabetes mellitus type 2 *Severe protein calorie malnutrition  Discussed with family with 2 sons at bedside.  Transition to complete comfort measures.  Stop meropenem.  Continue Foley catheter and oxygen.  Morphine and Ativan as needed.  No beds available at hospice home.  Transfer when bed available.  CODE STATUS: DO NOT RESUSCITATE  DVT Prophylaxis: SCDs  TOTAL TIME TAKING CARE OF THIS PATIENT: 25 minutes.   Greater than 50% time spent in discussing with family at bedside and coordinating care  POSSIBLE D/C IN 1-2 DAYS, DEPENDING ON CLINICAL CONDITION.  Molinda Bailiff Demonta Wombles M.D on 11/23/2017 at 2:53 PM  Between 7am to 6pm - Pager - [phone]  After 6pm go to [email or web address] - password [email or web address] [facility]  [facility]  Office  [phone]  CC: Primary care physician; Marguarite Arbour, MD  Note: This dictation was prepared with Dragon dictation along with smaller phrase technology. Any transcriptional errors that result from this process are unintentional.

## 2017-11-23 NOTE — Care Management Important Message (Signed)
Important Message  Patient Details  Name: Patrick Chan MRN: [id number] Date of Birth: [birth year]   Medicare Important Message Given:  Yes    Gwenette GreetBrenda S Mack Alvidrez, RN 11/23/2017, 8:24 AM

## 2017-11-23 NOTE — Progress Notes (Signed)
Chaplain met with the family and offered pastoral care. Chaplain reminded the family that they can page the on-call chaplain as needed.

## 2017-11-23 NOTE — Progress Notes (Signed)
Palliative:  Patrick Chan has done well overnight but has required a couple doses of morphine. Discussed with sons at bedside progression at EOL and expectations, signs, symptoms. Discussed full comfort care and they agree and also agree with transition to hospice if appropriate although I would not be surprised if he declined before bed available at hospice. Emotional support provided.   Late entry: I returned to check on Patrick Chan and his breathing is much more labored and with furrowed brow and restless. I discussed with family and RN and will begin morphine infusion to better ensure comfort. He has required multiple boluses to become more comfortable but does appear more comfortable before I left them. Prognosis poor and likely within hours - discussed with family. They have much support at bedside. Emotional support provided.   60 min  Yong ChannelAlicia Kellyanne Ellwanger, NP Palliative Medicine Team Pager # [phone] (M-F 8a-5p) Team Phone # [phone] (Nights/Weekends)

## 2017-11-24 LAB — CULTURE, BLOOD (ROUTINE X 2): SPECIAL REQUESTS: ADEQUATE

## 2017-11-26 ENCOUNTER — Encounter: Admission: RE | Admit: 2017-11-26 | Payer: Medicare Other | Source: Ambulatory Visit | Admitting: Internal Medicine

## 2017-11-26 NOTE — Death Summary Note (Signed)
DEATH SUMMARY   Patient Details  Name: Patrick Chan MRN: [id number] DOB: [birth year]  Admission/Discharge Information   Admit Date:  December 07, 2017  Date of Death:    Time of Death:    Length of Stay: 4  Referring Physician: Marguarite Arbour, MD   Reason(s) for Hospitalization   HISTORY OF PRESENT ILLNESS:  Patrick Chan  is a 84 y.o. male with a known history of seizure disorder, hypertension, paroxysmal atrial fibrillation comes from [facility] skilled nursing because of fever, lethargy.  According to family patient noted to have shaking with chills yesterday and today morning noticed fever, altered mental status, bradycardia, heart rate found to be 29 at one point as per chart.  Patient lethargic, history obtained from patient sons and also from ER charts. Temperature 103.1 as per EMS, started on BiPAP upon arrival because of tachypnea, respiratory distress.  And is off the BiPAP but on nonrebreather with 15 L of oxygen.  Hypotensive with blood pressure up to 95/52, patient received about 3 L of normal saline but blood pressure is still hovering between 100/46, 90/60.     Diagnoses  Preliminary cause of death:   * E coli bacteremia *Acute hypoxic respiratory failure *Septic shock secondary to E. coli UTI and bacteremia *Acute toxic and metabolic encephalopathy *Transient bradycardia *Acute kidney injury due to ATN *History of seizure disorder *Paroxysmal atrial fibrillation *Diabetes mellitus type 2 *Severe protein calorie malnutrition   Secondary Diagnoses (including complications and co-morbidities):  Active Problems:   Septic shock (HCC)   Goals of care, counseling/discussion   Palliative care encounter   Respiratory failure [facility])   Brief Hospital Course (including significant findings, care, treatment, and services provided and events leading to death)  Patrick Chan is a 84 y.o. year old male who was admitted to ICU due to septic shock.  His  blood cultures returned with E. coli which was from UTI.  Patient was treated aggressively with broad-spectrum antibiotics which were narrowed down once E. coli was confirmed.  He also had acute hypoxic respiratory failure and needed BiPAP support.  Slowly got off pressors.  Patient was transferred to floor but later continued to deteriorate in spite of aggressive care.  At this point a family meeting was held with palliative care involved with 2 of his sons who were his healthcare power of attorney's.  Due to no improvement patient was transitioned to comfort measures.  With his breathing continued to deteriorate he was started on a morphine drip.  Patient was pronounced dead on 12-11-17 at 2:30 AM.  Pertinent Labs and Studies  Significant Diagnostic Studies Dg Chest Port 1 View  Result Date: 11/21/2017 CLINICAL DATA:  Respiratory failure EXAM: PORTABLE CHEST 1 VIEW COMPARISON:  12/07/2017 FINDINGS: There is atelectatic change in the left base. Lungs elsewhere clear. Heart is upper normal in size with pulmonary vascularity within normal limits. No adenopathy. There is aortic atherosclerosis. No bone lesions. IMPRESSION: Left base atelectasis. Lungs elsewhere clear. Stable cardiac silhouette. There is aortic atherosclerosis. Aortic Atherosclerosis (ICD10-I70.0). Electronically Signed   By: Bretta Bang III M.D.   On: 11/21/2017 07:34   Dg Chest Port 1 View  Result Date: 2017/12/07 CLINICAL DATA:  Fever.  Weakness.  Combative.  Shortness of breath. EXAM: PORTABLE CHEST 1 VIEW COMPARISON:  06/26/2017. FINDINGS: Mediastinum and hilar structures normal. Cardiomegaly with normal pulmonary vascularity. Low lung volumes with mild basilar atelectasis. No pleural effusion or pneumothorax. IMPRESSION: Low lung volumes with mild basilar atelectasis.  Exam otherwise unremarkable. Electronically Signed   By: Maisie Fus  Register   On: 11/17/2017 11:07    Microbiology Recent Results (from the past 240 hour(s))   Blood Culture (routine x 2)     Status: Abnormal   Collection Time: 11/14/2017 10:25 AM  Result Value Ref Range Status   Specimen Description   Final    BLOOD RIGHT FA Performed at [facility], [address] [place], [place] [address]    Special Requests   Final    BOTTLES DRAWN AEROBIC AND ANAEROBIC Blood Culture adequate volume Performed at [facility], [address], [place], [place] [address]    Culture  Setup Time   Final    GRAM NEGATIVE RODS IN BOTH AEROBIC AND ANAEROBIC BOTTLES CRITICAL RESULT CALLED TO, READ BACK BY AND VERIFIED WITH: DAVID BESANTI 11/21/17 @ 0156  MLK Performed at [facility], [address] [place], [place] [address]    Culture (A)  Final    ESCHERICHIA COLI SUSCEPTIBILITIES PERFORMED ON PREVIOUS CULTURE WITHIN THE LAST 5 DAYS. Performed at [facility], [address], [place], [place] [address]    Report Status 11/23/2017 FINAL  Final  Blood Culture (routine x 2)     Status: Abnormal (Preliminary result)   Collection Time: 11/25/2017 10:25 AM  Result Value Ref Range Status   Specimen Description   Final    BLOOD LEFT HAND Performed at [facility], [address], [place], [place] [address]    Special Requests   Final    BOTTLES DRAWN AEROBIC AND ANAEROBIC Blood Culture adequate volume Performed at [facility], [address], [place], [place] [address]    Culture  Setup Time   Final    GRAM NEGATIVE RODS IN BOTH AEROBIC AND ANAEROBIC BOTTLES CRITICAL RESULT CALLED TO, READ BACK BY AND VERIFIED WITH: DAVID BESANTI 11/21/17 @ 0156  MLK    Culture (A)  Final    ESCHERICHIA COLI CULTURE REINCUBATED FOR BETTER GROWTH Performed at [facility], [address] [place], [place] [address]    Report Status PENDING  Incomplete   Organism ID, Bacteria ESCHERICHIA COLI  Final      Susceptibility   Escherichia coli - MIC*    AMPICILLIN >=32 RESISTANT Resistant     CEFAZOLIN <=4  SENSITIVE Sensitive     CEFEPIME <=1 SENSITIVE Sensitive     CEFTAZIDIME <=1 SENSITIVE Sensitive     CEFTRIAXONE <=1 SENSITIVE Sensitive     CIPROFLOXACIN >=4 RESISTANT Resistant     GENTAMICIN <=1 SENSITIVE Sensitive     IMIPENEM <=0.25 SENSITIVE Sensitive     TRIMETH/SULFA >=320 RESISTANT Resistant     AMPICILLIN/SULBACTAM 16 INTERMEDIATE Intermediate     PIP/TAZO <=4 SENSITIVE Sensitive     Extended ESBL NEGATIVE Sensitive     * ESCHERICHIA COLI  Blood Culture ID Panel (Reflexed)     Status: Abnormal   Collection Time: 11/01/2017 10:25 AM  Result Value Ref Range Status   Enterococcus species NOT DETECTED NOT DETECTED Final   Listeria monocytogenes NOT DETECTED NOT DETECTED Final   Staphylococcus species NOT DETECTED NOT DETECTED Final   Staphylococcus aureus NOT DETECTED NOT DETECTED Final   Streptococcus species NOT DETECTED NOT DETECTED Final   Streptococcus agalactiae NOT DETECTED NOT DETECTED Final   Streptococcus pneumoniae NOT DETECTED NOT DETECTED Final   Streptococcus pyogenes NOT DETECTED NOT DETECTED Final   Acinetobacter baumannii NOT DETECTED NOT DETECTED Final   Enterobacteriaceae species  DETECTED (A) NOT DETECTED Final    Comment: Enterobacteriaceae represent a large family of gram-negative bacteria, not a single organism. CRITICAL RESULT CALLED TO, READ BACK BY AND VERIFIED WITH: DAVID BESANTI 11/21/17 @ 0156  MLK    Enterobacter cloacae complex NOT DETECTED NOT DETECTED Final   Escherichia coli DETECTED (A) NOT DETECTED Final    Comment: CRITICAL RESULT CALLED TO, READ BACK BY AND VERIFIED WITH: DAVID BESANTI 11/21/17 @ 0156  MLK    Klebsiella oxytoca NOT DETECTED NOT DETECTED Final   Klebsiella pneumoniae NOT DETECTED NOT DETECTED Final   Proteus species NOT DETECTED NOT DETECTED Final   Serratia marcescens NOT DETECTED NOT DETECTED Final   Carbapenem resistance NOT DETECTED NOT DETECTED Final   Haemophilus influenzae NOT DETECTED NOT DETECTED Final   Neisseria  meningitidis NOT DETECTED NOT DETECTED Final   Pseudomonas aeruginosa NOT DETECTED NOT DETECTED Final   Candida albicans NOT DETECTED NOT DETECTED Final   Candida glabrata NOT DETECTED NOT DETECTED Final   Candida krusei NOT DETECTED NOT DETECTED Final   Candida parapsilosis NOT DETECTED NOT DETECTED Final   Candida tropicalis NOT DETECTED NOT DETECTED Final    Comment: Performed at [facility], [address], [place], [place] [address]  Urine culture     Status: Abnormal   Collection Time: 11/10/2017  1:37 PM  Result Value Ref Range Status   Specimen Description   Final    URINE, RANDOM Performed at [facility], [address] [place], [place] [address]    Special Requests   Final    NONE Performed at [facility], [address] [place], [place] [address]    Culture 80,000 COLONIES/mL ESCHERICHIA COLI (A)  Final   Report Status 11/22/2017 FINAL  Final   Organism ID, Bacteria ESCHERICHIA COLI (A)  Final      Susceptibility   Escherichia coli - MIC*    AMPICILLIN >=32 RESISTANT Resistant     CEFAZOLIN <=4 SENSITIVE Sensitive     CEFTRIAXONE <=1 SENSITIVE Sensitive     CIPROFLOXACIN >=4 RESISTANT Resistant     GENTAMICIN <=1 SENSITIVE Sensitive     IMIPENEM <=0.25 SENSITIVE Sensitive     NITROFURANTOIN <=16 SENSITIVE Sensitive     TRIMETH/SULFA >=320 RESISTANT Resistant     AMPICILLIN/SULBACTAM 16 INTERMEDIATE Intermediate     PIP/TAZO <=4 SENSITIVE Sensitive     Extended ESBL NEGATIVE Sensitive     * 80,000 COLONIES/mL ESCHERICHIA COLI  MRSA PCR Screening     Status: None   Collection Time: 11/04/2017  1:37 PM  Result Value Ref Range Status   MRSA by PCR NEGATIVE NEGATIVE Final    Comment:        The GeneXpert MRSA Assay (FDA approved for NASAL specimens only), is one component of a comprehensive MRSA colonization surveillance program. It is not intended to diagnose MRSA infection nor to guide or monitor treatment for MRSA  infections. Performed at [facility], [address] [place], [place] [address]     Lab Basic Metabolic Panel: Recent Labs  Lab 11/23/2017 1026 11/21/17 0551 11/22/17 0711  NA 138 142 144  K 4.4 3.7 3.9  CL 104 110 114*  CO2 20* 24 16*  GLUCOSE 250* 157* 196*  BUN 35* 25* 24*  CREATININE 1.58* 1.13 1.00  CALCIUM 9.1 8.3* 8.4*  MG  --  2.1 1.8  PHOS  --  2.9 2.9   Liver Function Tests: Recent Labs  Lab 11/25/2017 1026  AST  46*  ALT 17  ALKPHOS 81  BILITOT 0.9  PROT 6.9  ALBUMIN 2.9*   No results for input(s): LIPASE, AMYLASE in the last 168 hours. No results for input(s): AMMONIA in the last 168 hours. CBC: Recent Labs  Lab August 31, 2017 1026 11/21/17 0551 11/22/17 0749  WBC 14.7* 11.2* 20.7*  NEUTROABS 13.4*  --   --   HGB 12.9* 12.5* 13.3  HCT 40.3 39.0* 40.9  MCV 84.2 84.4 84.5  PLT 245 198 192   Cardiac Enzymes: Recent Labs  Lab August 31, 2017 1026  TROPONINI 0.05*   Sepsis Labs: Recent Labs  Lab August 31, 2017 1025 August 31, 2017 1026 August 31, 2017 1342 11/21/17 0551 11/22/17 0749  WBC  --  14.7*  --  11.2* 20.7*  LATICACIDVEN 6.1*  --  2.3* 1.2  --     Procedures/Operations  none   Wyndi Northrup R Caci Orren 11/19/2017, 3:02 AM

## 2017-11-26 NOTE — Progress Notes (Signed)
Pt expired on 11/10/2017 at 02:30 in the morning, IV and foley removed. Post mortem care done. MD notified, family notified and came at the bedside. Awaiting for the body to be transported at the morgue.

## 2017-11-26 DEATH — deceased

## 2017-12-22 IMAGING — DX DG CHEST 1V PORT
1 series · 1 of 1 positions shown · non-contrast
Comparison: Chest radiograph dated 06/24/2017

CLINICAL DATA: [AGE] male with sudden onset shortness of
breath.

EXAM:
PORTABLE CHEST 1 VIEW

[chest ap]
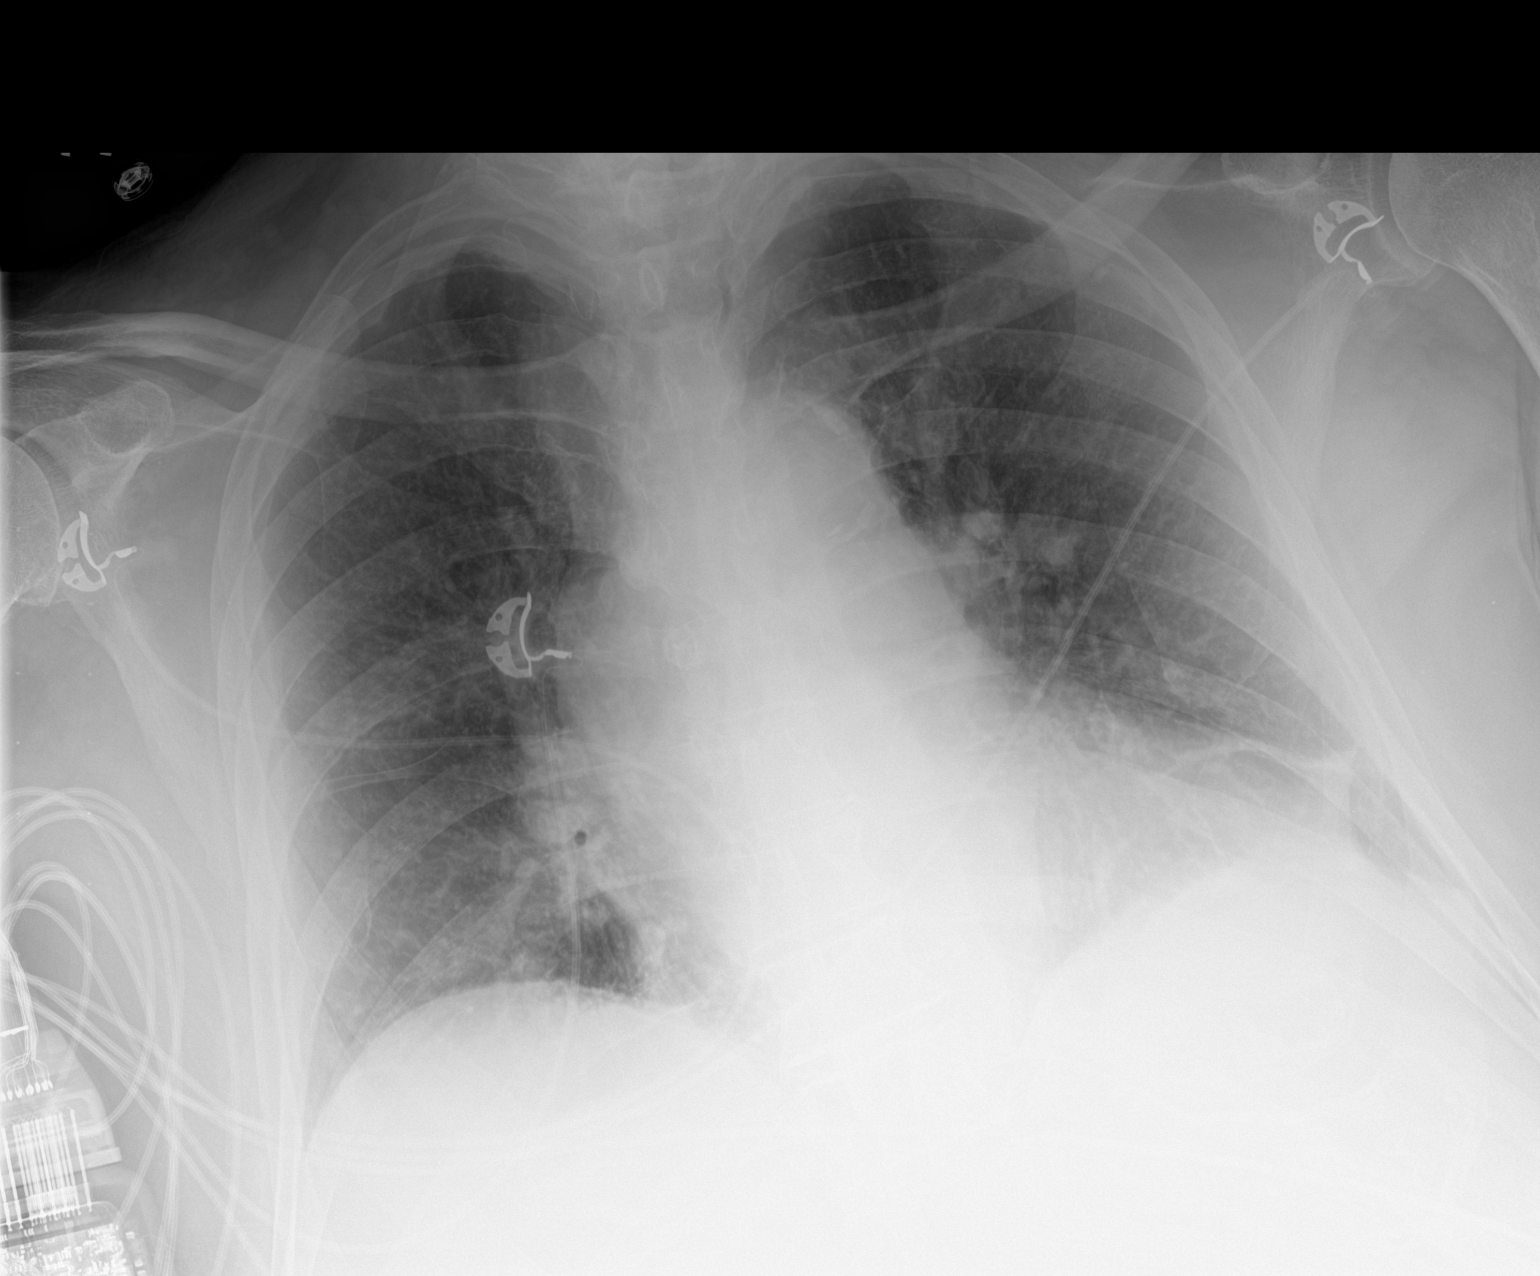

[1 of 1 positions shown; findings below may reference images not displayed]

FINDINGS: An area of streaky density at the left lung base may represent
atelectasis/ scarring or infiltrate. Clinical correlation is
recommended. There is no significant pleural effusion. No
pneumothorax. Mild cardiomegaly. There is atherosclerotic
calcification of the aortic arch. No acute osseous pathology.
IMPRESSION: Left lung base atelectasis/ scarring versus infiltrate. Clinical
correlation is recommended.
